# Patient Record
Sex: Male | Born: 1960
Health system: Southern US, Community
[De-identification: ages and names within clinical notes are randomized; demographics above are authoritative.]

## PROBLEM LIST (undated history)

## (undated) DIAGNOSIS — K219 Gastro-esophageal reflux disease without esophagitis: Secondary | ICD-10-CM

## (undated) DIAGNOSIS — I10 Essential (primary) hypertension: Secondary | ICD-10-CM

## (undated) HISTORY — PX: TOTAL HIP ARTHROPLASTY: SHX124

## (undated) HISTORY — PX: KNEE SURGERY: SHX244

---

## 2004-02-01 ENCOUNTER — Ambulatory Visit (HOSPITAL_BASED_OUTPATIENT_CLINIC_OR_DEPARTMENT_OTHER): Admission: RE | Admit: 2004-02-01 | Discharge: 2004-02-01 | Payer: Self-pay | Admitting: Orthopedic Surgery

## 2009-07-23 ENCOUNTER — Encounter: Admission: RE | Admit: 2009-07-23 | Discharge: 2009-07-23 | Payer: Self-pay | Admitting: Sports Medicine

## 2010-05-17 ENCOUNTER — Encounter: Admission: RE | Admit: 2010-05-17 | Discharge: 2010-05-17 | Payer: Self-pay | Admitting: Sports Medicine

## 2010-07-22 ENCOUNTER — Inpatient Hospital Stay (HOSPITAL_COMMUNITY)
Admission: RE | Admit: 2010-07-22 | Discharge: 2010-07-24 | Payer: Self-pay | Source: Home / Self Care | Attending: Orthopedic Surgery | Admitting: Orthopedic Surgery

## 2010-10-14 LAB — SURGICAL PCR SCREEN: Staphylococcus aureus: POSITIVE — AB

## 2010-10-14 LAB — BASIC METABOLIC PANEL
CO2: 29 mEq/L (ref 19–32)
CO2: 31 mEq/L (ref 19–32)
Calcium: 8.3 mg/dL — ABNORMAL LOW (ref 8.4–10.5)
Chloride: 105 mEq/L (ref 96–112)
Creatinine, Ser: 1.12 mg/dL (ref 0.4–1.5)
GFR calc Af Amer: >60 mL/min (ref >60)
GFR calc Af Amer: >60 mL/min (ref >60)
Potassium: 4.1 mEq/L (ref 3.5–5.1)
Sodium: 138 mEq/L (ref 135–145)
Sodium: 139 mEq/L (ref 135–145)

## 2010-10-14 LAB — URINALYSIS, ROUTINE W REFLEX MICROSCOPIC
Bilirubin Urine: NEGATIVE
Hgb urine dipstick: NEGATIVE
Protein, ur: NEGATIVE mg/dL
Specific Gravity, Urine: 1.022 (ref 1.005–1.030)
Urobilinogen, UA: 1 mg/dL (ref 0.0–1.0)

## 2010-10-14 LAB — CBC
HCT: 37.5 % — ABNORMAL LOW (ref 39.0–52.0)
HCT: 46.2 % (ref 39.0–52.0)
Hemoglobin: 12.5 g/dL — ABNORMAL LOW (ref 13.0–17.0)
Hemoglobin: 13.4 g/dL (ref 13.0–17.0)
Hemoglobin: 16.2 g/dL (ref 13.0–17.0)
MCH: 31.3 pg (ref 26.0–34.0)
MCH: 31.8 pg (ref 26.0–34.0)
MCH: 31.9 pg (ref 26.0–34.0)
MCHC: 35.1 g/dL (ref 30.0–36.0)
MCV: 93.8 fL (ref 78.0–100.0)
RBC: 4 MIL/uL — ABNORMAL LOW (ref 4.22–5.81)
RBC: 4.22 MIL/uL (ref 4.22–5.81)
WBC: 8.7 10*3/uL (ref 4.0–10.5)

## 2010-10-14 LAB — APTT: aPTT: 28 seconds (ref 24–37)

## 2010-10-14 LAB — COMPREHENSIVE METABOLIC PANEL
BUN: 13 mg/dL (ref 6–23)
CO2: 28 mEq/L (ref 19–32)
Calcium: 9.6 mg/dL (ref 8.4–10.5)
Creatinine, Ser: 1.61 mg/dL — ABNORMAL HIGH (ref 0.4–1.5)
GFR calc non Af Amer: 46 mL/min — ABNORMAL LOW (ref >60)
Glucose, Bld: 99 mg/dL (ref 70–99)

## 2010-10-14 LAB — TYPE AND SCREEN: Antibody Screen: NEGATIVE

## 2010-10-14 LAB — PROTIME-INR: Prothrombin Time: 12 seconds (ref 11.6–15.2)

## 2010-12-20 NOTE — Op Note (Signed)
NAME:  Jonathan Hoffman, KRANZ NO.:  [id number]   MEDICAL RECORD NO.:  [id number]                   PATIENT TYPE:  AMB   LOCATION:  DSC                                  FACILITY:  [facility]   PHYSICIAN:  Loreta Ave, M.D.              DATE OF BIRTH:  01-Apr-1961   DATE OF PROCEDURE:  02/01/2004  DATE OF DISCHARGE:                                 OPERATIVE REPORT   PREOPERATIVE DIAGNOSIS:  Post-traumatic prepatellar tubercle bursitis with  tendinosis and tearing of patellar tendon at tibial tubercle attachment.   POSTOPERATIVE DIAGNOSIS:  Post-traumatic prepatellar tubercle bursitis with  tendinosis and tearing of patellar tendon at tibial tubercle attachment with  retained sand debris within the bursa.   PROCEDURE:  Left knee exploration and excision of foreign body and bursa in  front of the tibial tubercle.  Extensive debridement and then primary repair  of patellar tendons and tibial tubercle augmented with a 5 mm absorbable  anchor and fiber wire.   SURGEON:  Loreta Ave, M.D.   ASSISTANT:  Arlys John D. Petrarca, P.A.-C.   ANESTHESIA:  General.   BLOOD LOSS:  Minimal.   TOURNIQUET TIME:  Forty-five minutes.   SPECIMENS:  None.   CULTURES:  None.   COMPLICATIONS:  None.   DRESSING:  Soft compressive with knee immobilizer.   PROCEDURE:  The patient was brought to the operating room and placed on the  operating room table in supine position.  After adequate anesthesia had been  obtained, the tourniquet was applied in the upper aspect of the left leg.  Prepped and draped in the usual sterile fashion.  The knee was examined with  full motion instability.  Exsanguinated with elevation of Esmarch.  The  tourniquet was inflated to 350 mmHg.  Incised an oblique incision off the  tubercle.  It was opened, extending a little proximal and distal.  The skin  and subcutaneous tissues were divided.  Very thick and chronic bursa in  front of the tubercle  and distal end of the patellar tendon was excised in  its entirety along with a __________ retained sandy debris foreign material.  Thoroughly irrigated.  The tendon itself had markedly thickened.  Tenon over  the bottom 3 cm, was excised.  Central tearing in the epidural tendon but  was approached with the longitudinal opening of the tendon from the tubercle  to about 3 cm proximal, 50% of the tendon centrally, and on the __________  medially were all torn off with marked degeneration.  All of this treated  with excision of all fibrinous debris, inflammatory tissue, and  nonfunctional tendon.  This left about 50% compromise of the tendon  attachment.  Thorough irrigation.  The 5 mm ankle was placed right in the  middle of the tubercle and the fiber wire suture was then weaved into the  patellar tendon at the distal end and then went well proximally up and down  the  tendon.  Sutured back to itself in the original longitudinal gap of the  tendon.  This gave a nice, firm anchoring of the tendon back to the  tubercle, restoring longitudinal continuity and attachment.  I could fully  flex without too much tension on the repair.  The wound was irrigated.  The  skin was closed with subcutaneous and subcuticular Vicryl.  The margins of  the wound were injected with Marcaine.  Steri-Strips applied.  Sterile  compressive dressing was applied.  The tourniquet was deflated and removed.  Immobilizer applied.  The anesthesia reversed.  Brought to the recovery  room.  Tolerated the surgery well with no complications.                                               Loreta Ave, M.D.    DFM/MEDQ  D:  02/02/2004  T:  02/03/2004  Job:  [id number]

## 2011-12-26 ENCOUNTER — Emergency Department (HOSPITAL_COMMUNITY)
Admission: EM | Admit: 2011-12-26 | Discharge: 2011-12-26 | Disposition: A | Discharge status: Home / Self Care | Payer: BC Managed Care – PPO | Attending: Emergency Medicine | Admitting: Emergency Medicine

## 2011-12-26 ENCOUNTER — Emergency Department (HOSPITAL_COMMUNITY): Payer: BC Managed Care – PPO

## 2011-12-26 ENCOUNTER — Encounter (HOSPITAL_COMMUNITY): Payer: Self-pay | Admitting: *Deleted

## 2011-12-26 DIAGNOSIS — I10 Essential (primary) hypertension: Secondary | ICD-10-CM | POA: Insufficient documentation

## 2011-12-26 DIAGNOSIS — R6884 Jaw pain: Secondary | ICD-10-CM | POA: Insufficient documentation

## 2011-12-26 DIAGNOSIS — M542 Cervicalgia: Secondary | ICD-10-CM | POA: Insufficient documentation

## 2011-12-26 DIAGNOSIS — M25519 Pain in unspecified shoulder: Secondary | ICD-10-CM | POA: Insufficient documentation

## 2011-12-26 DIAGNOSIS — R42 Dizziness and giddiness: Secondary | ICD-10-CM | POA: Insufficient documentation

## 2011-12-26 DIAGNOSIS — R079 Chest pain, unspecified: Secondary | ICD-10-CM | POA: Insufficient documentation

## 2011-12-26 DIAGNOSIS — R000 Tachycardia, unspecified: Secondary | ICD-10-CM | POA: Insufficient documentation

## 2011-12-26 HISTORY — DX: Essential (primary) hypertension: I10

## 2011-12-26 LAB — TROPONIN I: Troponin I: <0.3 ng/mL (ref <0.30)

## 2011-12-26 LAB — CBC
HCT: 45.4 % (ref 39.0–52.0)
Hemoglobin: 16.2 g/dL (ref 13.0–17.0)
MCHC: 35.7 g/dL (ref 30.0–36.0)
MCV: 89.7 fL (ref 78.0–100.0)
RDW: 12.6 % (ref 11.5–15.5)

## 2011-12-26 LAB — POCT I-STAT, CHEM 8
Calcium, Ion: 1.21 mmol/L (ref 1.12–1.32)
Creatinine, Ser: 1.1 mg/dL (ref 0.50–1.35)
Glucose, Bld: 112 mg/dL — ABNORMAL HIGH (ref 70–99)
HCT: 49 % (ref 39.0–52.0)
Hemoglobin: 16.7 g/dL (ref 13.0–17.0)

## 2011-12-26 LAB — D-DIMER, QUANTITATIVE: D-Dimer, Quant: 1.6 ug/mL-FEU — ABNORMAL HIGH (ref 0.00–0.48)

## 2011-12-26 LAB — POCT I-STAT TROPONIN I

## 2011-12-26 MED ORDER — OMEPRAZOLE 20 MG PO CPDR: 20.0000 mg | DELAYED_RELEASE_CAPSULE | Freq: Every day | ORAL | Status: DC

## 2011-12-26 MED ORDER — SODIUM CHLORIDE 0.9 % IV BOLUS (SEPSIS)
1000.0000 mL | Freq: Once | INTRAVENOUS | Status: AC
  Administered 2011-12-26: 1000 mL via INTRAVENOUS

## 2011-12-26 MED ORDER — SODIUM CHLORIDE 0.9 % IV SOLN: INTRAVENOUS | Status: DC

## 2011-12-26 MED ORDER — ASPIRIN 81 MG PO CHEW
324.0000 mg | CHEWABLE_TABLET | Freq: Once | ORAL | Status: AC
  Administered 2011-12-26: 324 mg via ORAL
  Filled 2011-12-26: qty 4

## 2011-12-26 MED ORDER — IOHEXOL 350 MG/ML SOLN
100.0000 mL | Freq: Once | INTRAVENOUS | Status: AC | PRN
  Administered 2011-12-26: 100 mL via INTRAVENOUS

## 2011-12-26 MED ORDER — AMLODIPINE BESYLATE 2.5 MG PO TABS: 2.5000 mg | ORAL_TABLET | Freq: Every day | ORAL | Status: DC

## 2011-12-26 NOTE — Consult Note (Signed)
Patient ID: Jonathan Hoffman MRN: [id number], DOB/AGE: [birth year]   Admit date: 12/26/2011 Date of Consult:   Primary Physician: [name], MD, MD Primary Cardiologist: new   Problem List: Past Medical History  Diagnosis Date  . Hypertension     has never taken medications or followup    History reviewed. No pertinent past surgical history.   Allergies: No Known Allergies  HPI:  Patient is a 51 year old with history of borderline HTN (not on meds) who was referred from [name] office for eval of jaw pain  Was last there a couple years ago  Patient notes over the past several days intermittent jaw tightness.  Bilateral.  More with talking.  Denies teeth grinding.  Does note increased reflux recently  Has taken prevacid in past.  Using tums more now..  .  No radiation  No chest tightness except around L upper chest.  Chest pain in this area gets worse with palpation and radiates to back.  This is known Has had this for a long time Has history of shoulder problems; has had injections.  Today what also concerned him was transient lightheadedness, not feeling well.  Never had before.  No syncope.  No SOB. Went to Dr Newell Coral office   Told to come here.  No SOB   No fevers, chills.   Patient does spin classes 2x per week.  Actually has felt better when  He does this  Did twice this week  No jaw pain with this.    Does admit to drinking up to 6 oz per day ETOH at times.  No tobacco.  Currently jaw still does not feel normal.  Not SOB.  No CP   Inpatient Medications:    . aspirin  324 mg Oral Once  . sodium chloride  1,000 mL Intravenous Once    History reviewed. No pertinent family history.   History   Social History  . Marital Status: Married    Spouse Name: N/A    Number of Children: N/A  . Years of Education: N/A   Occupational History  . Not on file.   Social History Main Topics  . Smoking status: Never Smoker   . Smokeless tobacco: Not on file   . Alcohol Use: Yes  . Drug Use:   . Sexually Active:    Other Topics Concern  . Not on file   Social History Narrative  . No narrative on file     Review of Systems: General: negative for chills, fever.  Weight is up some.  Needs to watch diet. Cardiovascular: negative for chest pain, dyspnea on exertion, edema, orthopnea, palpitations, paroxysmal nocturnal dyspnea or shortness of breath Dermatological: negative for rash Respiratory: negative for cough or wheezing Abdominal: negative for nausea, vomiting, diarrhea, bright red blood per rectum, melena, or hematemesis  All other systems reviewed and are otherwise negative except as noted above.  Physical Exam: Filed Vitals:   12/26/11 1516  BP: 171/108  Pulse: 88  Temp:   Resp: 16   No intake or output data in the 24 hours ending 12/26/11 1755  General: Well developed, well nourished, in no acute distress. Head: Normocephalic, atraumatic, sclera non-icteric  Jaw:  No real exacerbation with palpation of jaw. Neck: Negative for carotid bruits. JVP not elevated. Lungs: Clear bilaterally to auscultation without wheezes, rales, or rhonchi. Breathing is unlabored. Heart: RRR with S1 S2. No murmurs, rubs, or gallops appreciated. Chest:  Left upper chest pain that is worse  with palpation.   Abdomen: Soft, non-tender, non-distended with normoactive bowel sounds. No hepatomegaly. No rebound/guarding. No obvious abdominal masses. Msk:  Strength and tone appears normal for age. Extremities: No clubbing, cyanosis or edema.  Distal pedal pulses are 2+ and equal bilaterally. Neuro: Alert and oriented X 3. Moves all extremities spontaneously. Psych:  Responds to questions appropriately with a normal affect.  Labs: Results for orders placed during the hospital encounter of 12/26/11 (from the past 24 hour(s))  CBC     Status: Normal   Collection Time   12/26/11 11:48 AM      Component Value Range   WBC 7.3  4.0 - 10.5 (K/uL)   RBC 5.06   4.22 - 5.81 (MIL/uL)   Hemoglobin 16.2  13.0 - 17.0 (g/dL)   HCT 16.1  09.6 - 04.5 (%)   MCV 89.7  78.0 - 100.0 (fL)   MCH 32.0  26.0 - 34.0 (pg)   MCHC 35.7  30.0 - 36.0 (g/dL)   RDW 40.9  81.1 - 91.4 (%)   Platelets 197  150 - 400 (K/uL)  POCT I-STAT TROPONIN I     Status: Normal   Collection Time   12/26/11 12:00 PM      Component Value Range   Troponin i, poc 0.00  0.00 - 0.08 (ng/mL)   Comment 3           POCT I-STAT, CHEM 8     Status: Abnormal   Collection Time   12/26/11 12:01 PM      Component Value Range   Sodium 141  135 - 145 (mEq/L)   Potassium 4.4  3.5 - 5.1 (mEq/L)   Chloride 104  96 - 112 (mEq/L)   BUN 15  6 - 23 (mg/dL)   Creatinine, Ser 7.82  0.50 - 1.35 (mg/dL)   Glucose, Bld 956 (*) 70 - 99 (mg/dL)   Calcium, Ion 2.13  0.86 - 1.32 (mmol/L)   TCO2 26  0 - 100 (mmol/L)   Hemoglobin 16.7  13.0 - 17.0 (g/dL)   HCT 57.8  46.9 - 62.9 (%)  D-DIMER, QUANTITATIVE     Status: Abnormal   Collection Time   12/26/11  2:20 PM      Component Value Range   D-Dimer, Quant 1.60 (*) 0.00 - 0.48 (ug/mL-FEU)  TROPONIN I     Status: Normal   Collection Time   12/26/11  2:20 PM      Component Value Range   Troponin I <0.30  <0.30 (ng/mL)  TROPONIN I     Status: Normal   Collection Time   12/26/11  4:52 PM      Component Value Range   Troponin I <0.30  <0.30 (ng/mL)    Radiology/Studies: Dg Chest 2 View  12/26/2011  *RADIOLOGY REPORT*  Clinical Data: Chest pain.  CHEST - 2 VIEW  Comparison: 07/18/2010  Findings: Low lung volumes are again seen.  Both lungs are clear. Heart size is within normal limits.  No mass or lymphadenopathy identified.  IMPRESSION: Low lung volumes.  No active disease.  Original Report Authenticated By: Danae Orleans, M.D.   Ct Angio Chest W/cm &/or Wo Cm  12/26/2011  *RADIOLOGY REPORT*  Clinical Data: Chest pain.  Dizziness.  Elevated D-dimer.  CT ANGIOGRAPHY CHEST  Technique:  Multidetector CT imaging of the chest using the standard protocol during  bolus administration of intravenous contrast. Multiplanar reconstructed images including MIPs were obtained and reviewed to evaluate the vascular anatomy.  Contrast: OMNIPAQUE IOHEXOL 350 MG/ML SOLN  Comparison: None.  Findings: Satisfactory opacification of the pulmonary arteries noted, and there is no evidence of pulmonary emboli.  No evidence of thoracic aortic aneurysm or dissection.  No evidence of mediastinal hematoma or mass.  No evidence of pericardial or pleural effusion.  The both lungs are clear.  No evidence of pulmonary infiltrate or mass.  Central tracheobronchial airways are patent.  IMPRESSION: Negative.  No evidence of pulmonary embolism or other active disease.  Original Report Authenticated By: Danae Orleans, M.D.    EKG:SR 86 bpm.  INcomplete RBBB.  ASSESSMENT AND PLAN:   Patient is a 52 year old with no history of CAD  Presents with bilateral jaw pain that has been intermitt over about a week.  Atypical.  Not associated with aerobic activty.  Actually has felt better when he exercises. Does have known intermitt CP that is worse with palpation  Exam with signif elevation of BP Also,  CP exacerb with palpation on L EKG unremarkable. CT negative for PE.  No coronary calcification seen Labs negative trop.  D dimer increased    OVerall, I do not think jaw pain is an anginal equivalent.  May be related to reflux.  May be related to HTN.  May be unrelated to either  For now I would recomm empiric Rx of reflux with omeprazole. I would also begin Rx for HTN.  Start low with amlodipine 2.5 mg.   Will arrange f/u in clinic Counselled on limiting alcohol.  Keep active.  Try to cut down wt some Will need check of fasting lipids as outpatient  OK to D/C home  Again, will arrange outpatient f/u.    Signed, Dietrich Pates 12/26/2011, 5:55 PM

## 2011-12-26 NOTE — Discharge Instructions (Signed)
It is very important to follow up as directed, with your cardiologist.  If you develop any new, or concerning changes in your condition, please return to the emergency department immediately.

## 2011-12-26 NOTE — ED Notes (Signed)
Pt presents with 1 week h/o increasing blood pressure.  Pt reports increased stress at work, has been seen x 2 previously for HTN but did not follow up.  Pt reports 3-4 year h/o L shoulder pain, usually gets steroid injections for pain and has been taken ibuprofen for same that relieves pain.  Pt reports pain to L upper chest that is the same pain as shoulder pain in previous years.  Pt denies any shortness of breath or difference in pain.

## 2011-12-26 NOTE — ED Provider Notes (Signed)
Patient seen in stricture triage and screen. Patient was referred in by his primary care doctor's office for admission for take symptoms of chest discomfort dizziness and jaw pain on and off for the past 5 days. Patient had an episode this morning while in court. I went to see his primary care doctor referred here. Past medical history for cardiac risk factors is negative except for a new finding today of hypertension and was told he was borderline in the past but not treated for hypertension. The duration of the chest or jaw discomfort at most has been 15-20 minutes.  Initial workup in the emergency department troponin was negative chest x-ray was negative patient could be eliciting early warning signs of coronary artery disease we'll repeat the troponin will get a d-dimer case were dealing with a pulmonary embolism type process. Found out subsequently that his primary care doctor had called Dr. Carleene Cooper and that they had recommended direct admission through the ED. Will have patient moved back to the a pot for further workup from the stricture triage area.  Patient given aspirin 325 mg by mouth to chew.    Shelda Jakes, MD 12/26/11 [phone]

## 2011-12-26 NOTE — ED Provider Notes (Signed)
History     CSN: [id number]  Arrival date & time 12/26/11  1126   First MD Initiated Contact with Patient 12/26/11 1311      Chief Complaint  Patient presents with  . Chest Pain  . Arm Pain     HPI This 51 year old male presents with left shoulder pain, new neck and jaw pain and lightheadedness.  He notes that for some time he has had intermittent left shoulder pain, which is constant, reproducible with positioning, not exertional, sore.  Over the past 3 days he has become aware of intermittent bilateral jaw tightness.  He notes that the symptom seems more present with activity, seems to improve with rest.  There is minimal radiation inferiorly.  He notes mild generalized neck discomfort currently.  Just complains of mild lightheadedness throughout these past few days.  He denies any central anterior chest pain, significant dyspnea.  He denies any fevers, chills.  He has a prior history of borderline hypertension, with no active medication use.  Today after his symptoms were persistent he saw a primary care physician.  He was referred here for further evaluation. Past Medical History  Diagnosis Date  . Hypertension     has never taken medications or followup    History reviewed. No pertinent past surgical history.  History reviewed. No pertinent family history.  History  Substance Use Topics  . Smoking status: Never Smoker   . Smokeless tobacco: Not on file  . Alcohol Use: Yes      Review of Systems  Constitutional:       Per HPI, otherwise negative  HENT:       Per HPI, otherwise negative  Eyes: Negative.   Respiratory:       Per HPI, otherwise negative  Cardiovascular:       Per HPI, otherwise negative  Gastrointestinal: Negative for vomiting.  Genitourinary: Negative.   Musculoskeletal:       Per HPI, otherwise negative  Skin: Negative.   Neurological: Negative for syncope.    Allergies  Review of patient's allergies indicates no known allergies.  Home  Medications   Current Outpatient Rx  Name Route Sig Dispense Refill  . HYDROCODONE-ACETAMINOPHEN 5-325 MG PO TABS Oral Take 1 tablet by mouth every 8 (eight) hours as needed. For pain    . IBUPROFEN 200 MG PO TABS Oral Take 200 mg by mouth every 8 (eight) hours as needed. For pain      BP 171/108  Pulse 88  Temp(Src) 97.9 F (36.6 C) (Oral)  Resp 16  SpO2 100%  Physical Exam  Nursing note and vitals reviewed. Constitutional: He is oriented to person, place, and time. He appears well-developed. No distress.  HENT:  Head: Normocephalic and atraumatic.  Eyes: Conjunctivae and EOM are normal.  Cardiovascular: Normal rate and regular rhythm.   Pulmonary/Chest: Effort normal. No stridor. No respiratory distress.  Abdominal: He exhibits no distension.  Musculoskeletal: He exhibits no edema.  Neurological: He is alert and oriented to person, place, and time.  Skin: Skin is warm and dry.  Psychiatric: He has a normal mood and affect.    ED Course  Procedures (including critical care time)  Labs Reviewed  POCT I-STAT, CHEM 8 - Abnormal; Notable for the following:    Glucose, Bld 112 (*)    All other components within normal limits  D-DIMER, QUANTITATIVE - Abnormal; Notable for the following:    D-Dimer, Quant 1.60 (*)    All other components within normal limits  CBC  POCT I-STAT TROPONIN I  TROPONIN I   Dg Chest 2 View  12/26/2011  *RADIOLOGY REPORT*  Clinical Data: Chest pain.  CHEST - 2 VIEW  Comparison: 07/18/2010  Findings: Low lung volumes are again seen.  Both lungs are clear. Heart size is within normal limits.  No mass or lymphadenopathy identified.  IMPRESSION: Low lung volumes.  No active disease.  Original Report Authenticated By: Danae Orleans, M.D.     No diagnosis found.  Cardiac: 88sr, normal  Pulse ox 100%, ra normal   Date: 12/26/2011  Rate: 86  Rhythm: normal sinus rhythm  QRS Axis: normal  Intervals: normal  ST/T Wave abnormalities: normal   Conduction Disutrbances:none  Narrative Interpretation:   Old EKG Reviewed: no sig changes Unremarkable ECG  6:09 PM Patient resting comfortably, we discussed all results thus far. MDM  This patient presents with new complaints of jaw tightness, lightheadedness, and on initial exam is hypertensive, tachycardic.  Given the patient's description of hypertension, has hypercholesterolemia and has new complaints there is some suspicion of unstable angina, though the absence of any chest pain, or true dyspnea is reassuring.  The patient's initial evaluation, including his ECG, troponin, labs his reassuring.  The patient did have a positive d-dimer, which was evaluated with a CT of his chest, which was negative.  Throughout this time period the patient remained essentially asymptomatic.  The patient was seen by our cardiology colleagues.  Given the absence of notable distress, his reassuring evaluation here, he was discharged to follow up with cardiology for further provocative testing.  Gerhard Munch, MD 12/26/11 [phone]

## 2011-12-26 NOTE — ED Notes (Signed)
Pt states went to doctors office this am, reports dizziness, left sided chest discomfort (states he has nerve isssues to the left side of his chest, states if he presses on his chest pain radiates down left arm), reports episode of clamminess at doctors office.

## 2012-01-02 ENCOUNTER — Ambulatory Visit (INDEPENDENT_AMBULATORY_CARE_PROVIDER_SITE_OTHER): Payer: BC Managed Care – PPO | Admitting: Internal Medicine

## 2012-01-02 ENCOUNTER — Encounter: Payer: Self-pay | Admitting: Internal Medicine

## 2012-01-02 VITALS — BP 148/105 | HR 72 | Ht 73.0 in | Wt 243.0 lb

## 2012-01-02 DIAGNOSIS — K219 Gastro-esophageal reflux disease without esophagitis: Secondary | ICD-10-CM

## 2012-01-02 DIAGNOSIS — R6884 Jaw pain: Secondary | ICD-10-CM

## 2012-01-02 DIAGNOSIS — I10 Essential (primary) hypertension: Secondary | ICD-10-CM

## 2012-01-02 MED ORDER — AMLODIPINE BESYLATE 5 MG PO TABS: 5.0000 mg | ORAL_TABLET | Freq: Every day | ORAL | Status: DC

## 2012-01-02 MED ORDER — OMEPRAZOLE 20 MG PO CPDR: 20.0000 mg | DELAYED_RELEASE_CAPSULE | Freq: Every day | ORAL | Status: DC

## 2012-01-02 NOTE — Progress Notes (Signed)
HPI Patient is a 51 year old who was seen 1 week ago in the [facility] ER  He presents today for follow up The patient was seen one week ago.  Noted intermittent jaw tightness, bilaterally.  More with talking  Did not think he ground his teeth.  Said sympoms not associated with activity, felt better when exercising. Also had some L shoulder pain that he has had fro years, attrib to DJD of shoulder. Noted to be hypertensive in ER Impression was jaw discomfort was not cardiac Did not reflux  Given Rx for Omeprazole. Also added  norvasc 2.5 to regimen  Since seen he has been taking norvasc.  Still with tightness in jaw. Again, not associated with activity. Breathing is good.  Feels good doing spin.  No CP Does note improvement in reflux Is drinking less. No Known Allergies  Current Outpatient Prescriptions  Medication Sig Dispense Refill  . amLODipine (NORVASC) 2.5 MG tablet Take 1 tablet (2.5 mg total) by mouth daily.  30 tablet  0  . HYDROcodone-acetaminophen (NORCO) 5-325 MG per tablet Take 1 tablet by mouth every 8 (eight) hours as needed. For pain      . omeprazole (PRILOSEC) 20 MG capsule Take 1 capsule (20 mg total) by mouth daily.  30 capsule  0    Past Medical History  Diagnosis Date  . Hypertension     has never taken medications or followup    Past Surgical History  Procedure Date  . Total hip arthroplasty   . Knee surgery     Family History  Problem Relation Age of Onset  . Cancer Maternal Grandfather   . Cancer Paternal Grandfather     History   Social History  . Marital Status: Married    Spouse Name: N/A    Number of Children: N/A  . Years of Education: N/A   Occupational History  . Not on file.   Social History Main Topics  . Smoking status: Never Smoker   . Smokeless tobacco: Not on file  . Alcohol Use: Yes     about 4 t imes a week  . Drug Use: Not on file     unknown- 1987 0r prior to that  . Sexually Active: Not on file   Other Topics  Concern  . Not on file   Social History Narrative  . No narrative on file    Review of Systems:  All systems reviewed.  They are negative to the above problem except as previously stated.  Vital Signs: BP 148/105  Pulse 72  Ht 6\' 1"  (1.854 m)  Wt 243 lb (110.224 kg)  BMI 32.06 kg/m2  Physical Exam  Patient is in NAD Lungs CTA.  No wheezes or rales. Cardiac RRR.  No S3.  No murmurs. Ext No edema.  Assessment and Plan:  1.  HTN  Still not controlled.  I would recomm increasing Norvasc to 5 mg per day.  F/U in 4 to 6 wks. Patient has cut back on alcohol.  Rec wt loss to low 200s. Stay active Extensive time spent counselling on BP, wt, heart healthy habits. Follow up 1 month.  2.  Jaw pain.  This is symptom he had when he went to th ER 1 week ago.  Again he reconfirms that it is not associated with activity.   Will discuss with ENT options for evaluation.  3.  Reflux.  Continue omeprazole.  Watch foods that exacerbate.  Lose wt.

## 2012-01-02 NOTE — Patient Instructions (Signed)
Follow up with Dr.Ross in 6 weeks.  

## 2012-01-05 ENCOUNTER — Other Ambulatory Visit: Payer: Self-pay | Admitting: *Deleted

## 2012-01-05 ENCOUNTER — Telehealth: Payer: Self-pay | Admitting: *Deleted

## 2012-01-05 NOTE — Telephone Encounter (Signed)
LEFT MESSAGE FOR PT TO CALL BACK RE WHEN IT WOULD BE CONVENIENT  TO SCHEDULE  CT SINUS . AWAITING RETURN CALL ./CY

## 2012-01-06 NOTE — Telephone Encounter (Signed)
LEFT MESSAGE ONCE AGAIN TO SEE WHEN  COULD SCHEDULE CT SINUS./CY

## 2012-01-17 IMAGING — CR DG CHEST 2V
2 series · 2 of 2 positions shown · non-contrast
Comparison: None

CLINICAL DATA: Right hip replacement, preop.

CHEST - 2 VIEW

[view not recorded (1 of 2)]
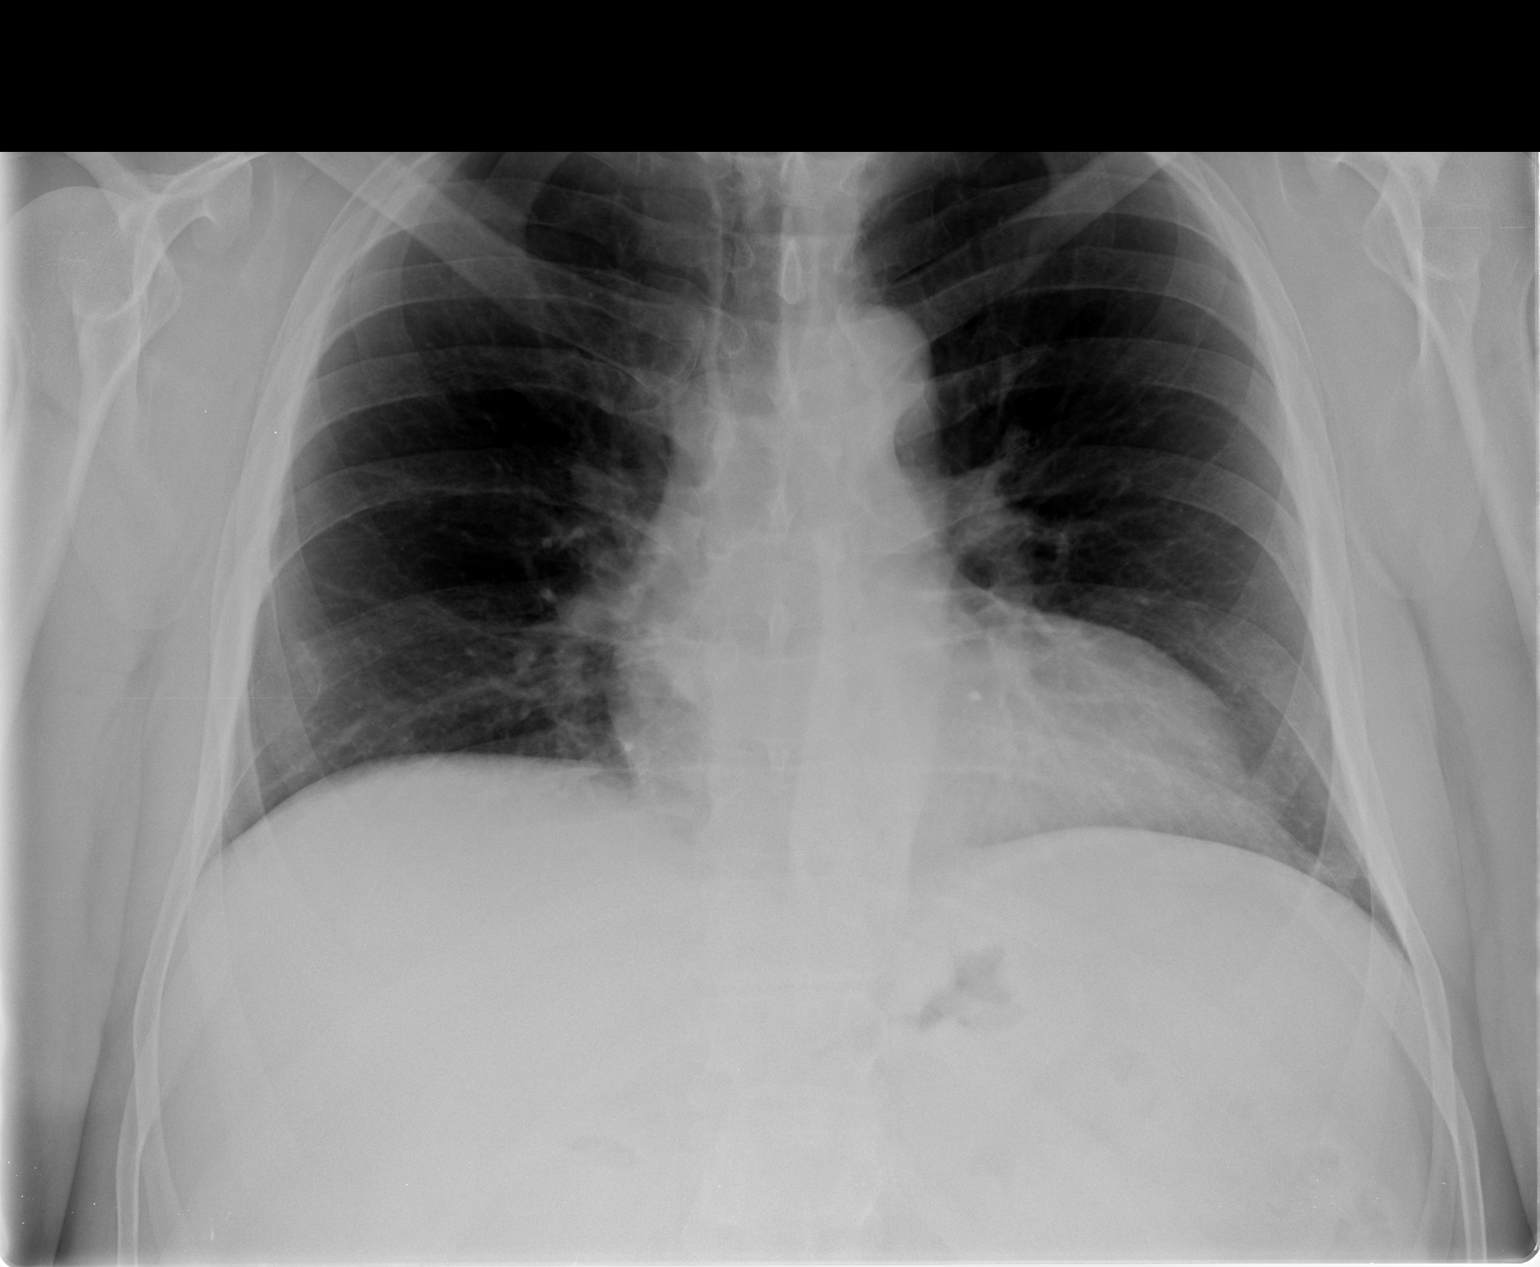

[view not recorded (2 of 2)]
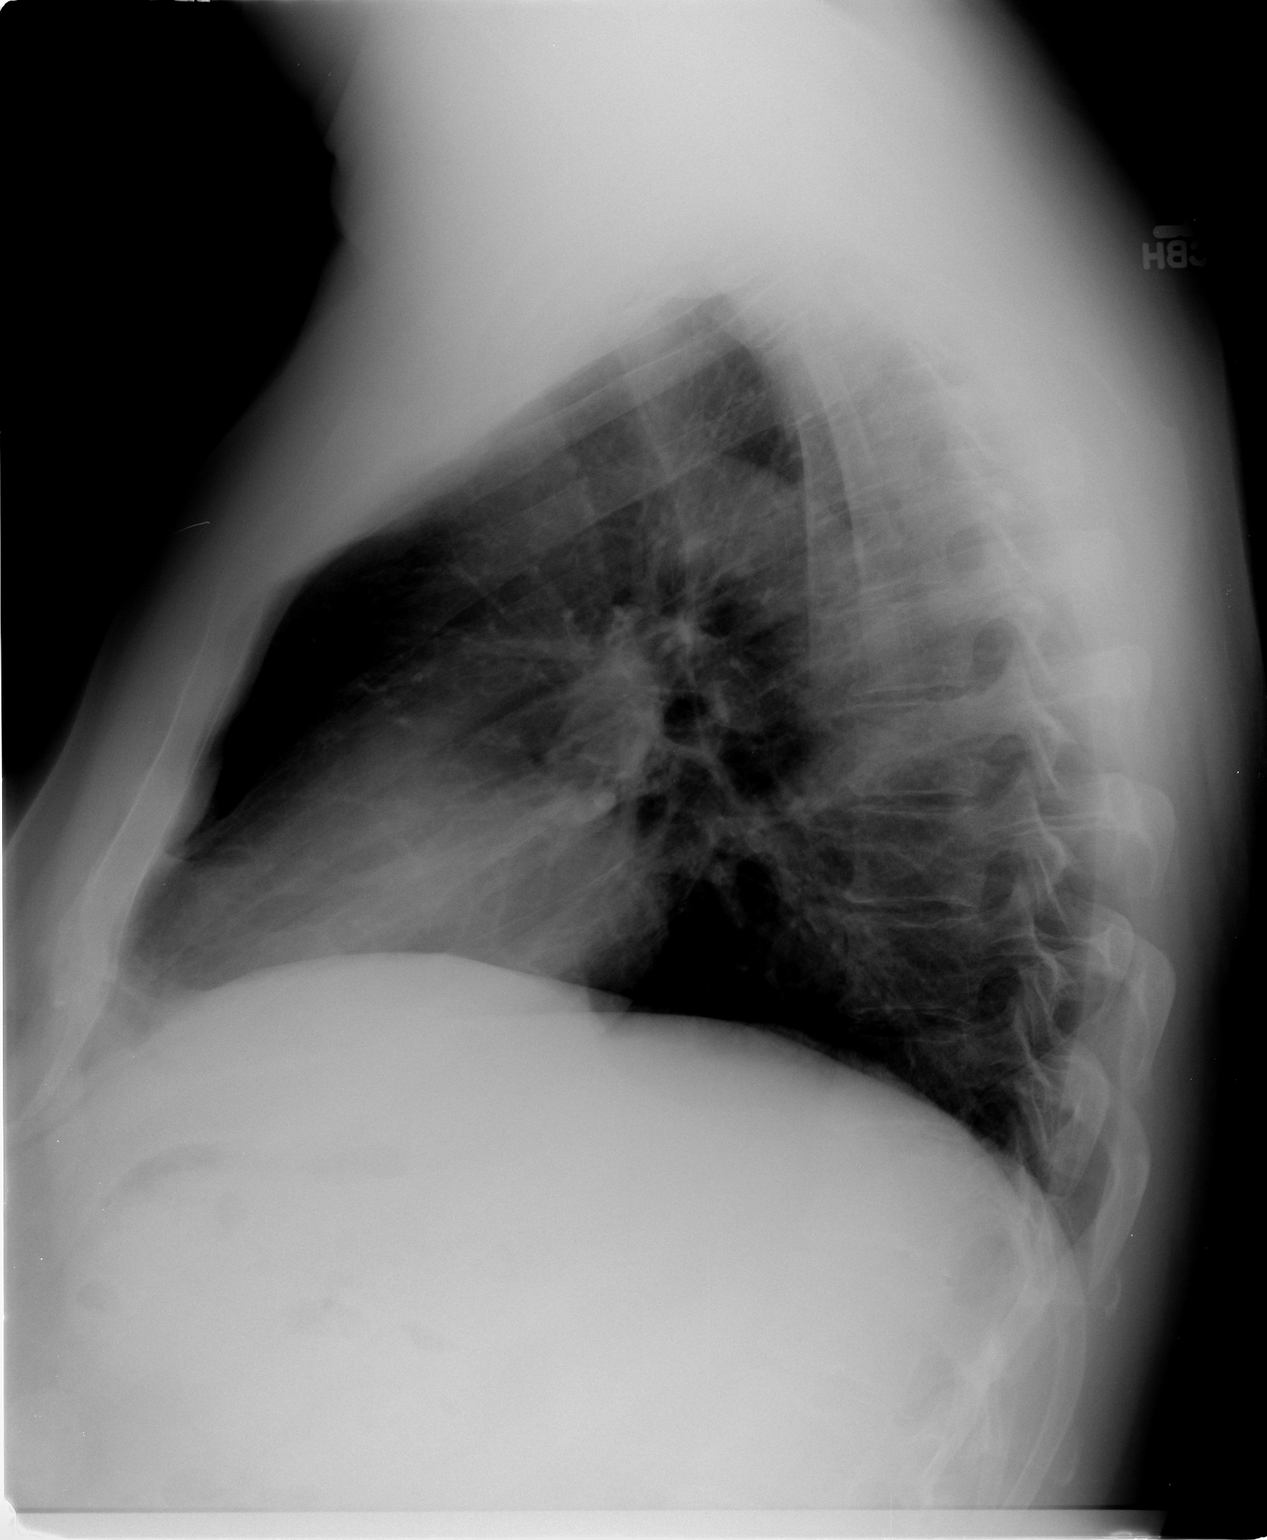

[2 of 2 positions shown; findings below may reference images not displayed]

FINDINGS: Heart and mediastinal contours are within normal limits.
No focal opacities or effusions.  No acute bony abnormality.
IMPRESSION: No active disease.

## 2012-03-04 ENCOUNTER — Ambulatory Visit: Payer: BC Managed Care – PPO | Admitting: Internal Medicine

## 2012-05-04 ENCOUNTER — Encounter: Payer: Self-pay | Admitting: Internal Medicine

## 2012-09-21 ENCOUNTER — Other Ambulatory Visit: Payer: Self-pay

## 2012-09-21 MED ORDER — AMLODIPINE BESYLATE 5 MG PO TABS: 5.0000 mg | ORAL_TABLET | Freq: Every day | ORAL | Status: DC

## 2012-09-21 MED ORDER — OMEPRAZOLE 20 MG PO CPDR: 20.0000 mg | DELAYED_RELEASE_CAPSULE | Freq: Every day | ORAL | Status: DC

## 2012-09-27 ENCOUNTER — Other Ambulatory Visit: Payer: Self-pay | Admitting: Internal Medicine

## 2012-12-06 ENCOUNTER — Telehealth: Payer: Self-pay | Admitting: Internal Medicine

## 2012-12-06 NOTE — Telephone Encounter (Signed)
New problem   Pt want to know if he BP medication can be increased. Please call pt

## 2012-12-07 NOTE — Telephone Encounter (Signed)
Patient has not been seen in 1 year.  Called to report that PCP says his blood pressure is high at 140/85.  Pt states he is going to increase his Norvasc from 5mg  to 7.5mg  until he sees Dr. Tenny Craw on 12/17/2012.  Advised pt against increasing meds.  Pt pretty much says that is what he is going to do.  Advised pt to keep a good record of his reading and bring to appt.  Pt agreed.

## 2012-12-17 ENCOUNTER — Encounter: Payer: Self-pay | Admitting: Internal Medicine

## 2012-12-17 ENCOUNTER — Ambulatory Visit (INDEPENDENT_AMBULATORY_CARE_PROVIDER_SITE_OTHER): Payer: BC Managed Care – PPO | Admitting: Internal Medicine

## 2012-12-17 VITALS — BP 156/109 | HR 78 | Ht 73.0 in | Wt 243.0 lb

## 2012-12-17 DIAGNOSIS — I10 Essential (primary) hypertension: Secondary | ICD-10-CM

## 2012-12-17 LAB — LIPID PANEL: HDL: 82.7 mg/dL (ref >39.00)

## 2012-12-17 LAB — CBC WITH DIFFERENTIAL/PLATELET
Basophils Relative: 0.3 % (ref 0.0–3.0)
Eosinophils Absolute: 0.1 10*3/uL (ref 0.0–0.7)
Hemoglobin: 16.1 g/dL (ref 13.0–17.0)
MCHC: 34.2 g/dL (ref 30.0–36.0)
MCV: 92 fl (ref 78.0–100.0)
Monocytes Absolute: 0.8 10*3/uL (ref 0.1–1.0)
Neutro Abs: 7.3 10*3/uL (ref 1.4–7.7)
Neutrophils Relative %: 71.9 % (ref 43.0–77.0)
RBC: 5.14 Mil/uL (ref 4.22–5.81)

## 2012-12-17 LAB — BASIC METABOLIC PANEL
Calcium: 9.8 mg/dL (ref 8.4–10.5)
GFR: 81.45 mL/min (ref >60.00)
Glucose, Bld: 104 mg/dL — ABNORMAL HIGH (ref 70–99)
Sodium: 139 mEq/L (ref 135–145)

## 2012-12-17 MED ORDER — TRIAMTERENE-HCTZ 37.5-25 MG PO TABS: 0.5000 | ORAL_TABLET | Freq: Every day | ORAL | Status: DC

## 2012-12-17 NOTE — Patient Instructions (Addendum)
Your physician recommends that you schedule a follow-up appointment in: 1 month with Dr. Tenny Craw.  LABS TODAY:  Cbc, bmet, lipids, tsh  Start Maxzide 37.5/25mg  1/2 tablet daily.

## 2012-12-17 NOTE — Progress Notes (Signed)
HPI Patient is a 52 yo who presents for reevaluation.  He was last in clinic about 1 year ago.  PRior to that I saw him in the ER for jaw tightness.  Flonnie Hailstone I saw him last spring I did not think jaw discomfort was cardiac in origin. I did start him on amlodipine for his blood pressure.  Since seen last he remains active.  Does spin class a couple times per week  No CP  No Jaw pain associated  Breathing is OK He did have BP checked and it has remained high No Known Allergies  Current Outpatient Prescriptions  Medication Sig Dispense Refill  . amLODipine (NORVASC) 5 MG tablet TAKE 1 TABLET BY MOUTH EVERY DAY  30 tablet  0  . HYDROcodone-acetaminophen (NORCO) 5-325 MG per tablet Take 1 tablet by mouth every 8 (eight) hours as needed. For pain      . omeprazole (PRILOSEC) 20 MG capsule TAKE ONE CAPSULE BY MOUTH EVERY DAY  30 capsule  0   No current facility-administered medications for this visit.    Past Medical History  Diagnosis Date  . Hypertension     has never taken medications or followup    Past Surgical History  Procedure Laterality Date  . Total hip arthroplasty    . Knee surgery      Family History  Problem Relation Age of Onset  . Cancer Maternal Grandfather   . Cancer Paternal Grandfather     History   Social History  . Marital Status: Married    Spouse Name: N/A    Number of Children: N/A  . Years of Education: N/A   Occupational History  . Not on file.   Social History Main Topics  . Smoking status: Never Smoker   . Smokeless tobacco: Not on file  . Alcohol Use: Yes     Comment: about 4 t imes a week  . Drug Use: Not on file     Comment: unknown- 1987 0r prior to that  . Sexually Active: Not on file   Other Topics Concern  . Not on file   Social History Narrative  . No narrative on file    Review of Systems:  All systems reviewed.  They are negative to the above problem except as previously stated.  Vital Signs: BP 156/109  Pulse 78  Ht 6\' 1"   (1.854 m)  Wt 243 lb (110.224 kg)  BMI 32.07 kg/m2  Physical Exam Patient is in NAD HEENT:  Normocephalic, atraumatic. EOMI, PERRLA.  Neck: JVP is normal.  No bruits.  Lungs: clear to auscultation. No rales no wheezes.  Heart: Regular rate and rhythm. Normal S1, S2. No S3.   No significant murmurs. PMI not displaced.  Abdomen:  Supple, nontender. Normal bowel sounds. No masses. No hepatomegaly.  Extremities:   Good distal pulses throughout. No lower extremity edema.  Musculoskeletal :moving all extremities.  Neuro:   alert and oriented x3.  CN II-XII grossly intact.  EKG:  SR  rSR' V1  (unchanged from 2013) Assessment and Plan:  1.  HTN  Patients BP is still not controlled  Discussed weight loss, limiting alcohol I would recomm adding another agent to his regimen  I do nto think going up on amlodipine will cover. I would add Maxzide 37.5/25   Take 1/2 tab per day I think if he makes some changes in diet and loses weight he will help his bp  He is motivated to do this. Will check  labs today. F/u in clinic in 1 month.  2.  HCM  Will check lipids, CBC< TSH today.

## 2013-01-25 ENCOUNTER — Other Ambulatory Visit: Payer: Self-pay | Admitting: Internal Medicine

## 2013-01-28 ENCOUNTER — Ambulatory Visit (INDEPENDENT_AMBULATORY_CARE_PROVIDER_SITE_OTHER): Payer: BC Managed Care – PPO | Admitting: Internal Medicine

## 2013-01-28 ENCOUNTER — Encounter: Payer: Self-pay | Admitting: Internal Medicine

## 2013-01-28 VITALS — BP 144/104 | HR 79 | Ht 73.0 in | Wt 242.0 lb

## 2013-01-28 DIAGNOSIS — I10 Essential (primary) hypertension: Secondary | ICD-10-CM

## 2013-01-28 MED ORDER — AMLODIPINE BESYLATE 5 MG PO TABS: 5.0000 mg | ORAL_TABLET | Freq: Two times a day (BID) | ORAL | Status: DC

## 2013-01-28 NOTE — Progress Notes (Signed)
HPI Patient is a 52 yo with a history of HTN and reflux.  Also history of atypical jaw pain.  I saw him in May  At that visit  I increased norvasc to 5 mg.  Told to continue maxzide   SInce seen he denies CP  No SOB  No edema.  He has cut back on drinking alcohol, still drinks on weekends BP at home has been high when he has checked. No Known Allergies HPI  No Known Allergies  Current Outpatient Prescriptions  Medication Sig Dispense Refill  . amLODipine (NORVASC) 5 MG tablet Take 1 tablet (5 mg total) by mouth 2 (two) times daily.  180 tablet  3  . HYDROcodone-acetaminophen (NORCO) 5-325 MG per tablet Take 1 tablet by mouth every 8 (eight) hours as needed. For pain      . omeprazole (PRILOSEC) 20 MG capsule TAKE 1 CAPSULE BY MOUTH ONCE DAILY  30 capsule  10  . triamterene-hydrochlorothiazide (MAXZIDE-25) 37.5-25 MG per tablet Take 0.5 tablets by mouth daily.  45 tablet  3   No current facility-administered medications for this visit.      Vital Signs: BP 144/104  Pulse 79  Ht 6\' 1"  (1.854 m)  Wt 242 lb (109.77 kg)  BMI 31.93 kg/m2   Assessment and Plan:  1.  HTN  Not optimal.  I would increase Norvasc to 10  Keep on current maxzide but may need to increase as well.  Patient would like to do in stepwise fashion He will email BP response in a few wks.  Adjustments will be made then.

## 2013-01-31 ENCOUNTER — Other Ambulatory Visit: Payer: Self-pay | Admitting: *Deleted

## 2013-06-26 IMAGING — CT CT ANGIO CHEST
2 of 6 series · 19 of 46 positions shown · IV contrast (APPLIED)
Comparison: None.

CLINICAL DATA: Chest pain.  Dizziness.  Elevated D-dimer.

CT ANGIOGRAPHY CHEST
TECHNIQUE: Multidetector CT imaging of the chest using the
standard protocol during bolus administration of intravenous
contrast. Multiplanar reconstructed images including MIPs were
obtained and reviewed to evaluate the vascular anatomy.
Contrast: 100mL OMNIPAQUE IOHEXOL 350 MG/ML SOLN

[Series 6: pulm embolism 1.0 b25f thin · axial · 0.76mm/px · z∈[-100,+128]mm · 16 of 250 slices shown]
[im 11/250  lung]
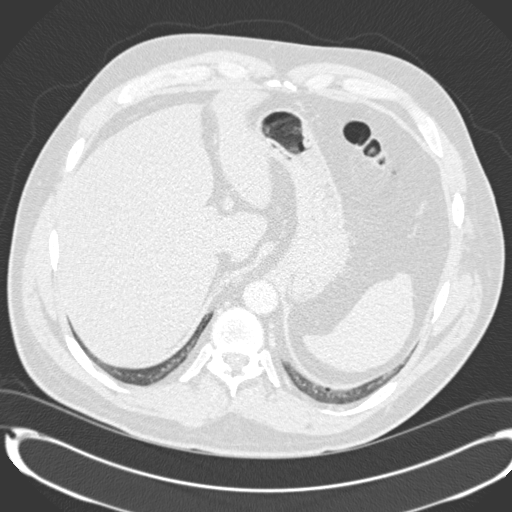
[im 33/250  soft-tissue]
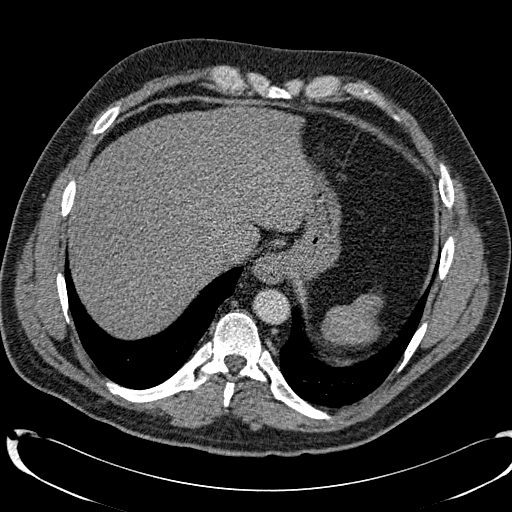
[im 44/250  lung]
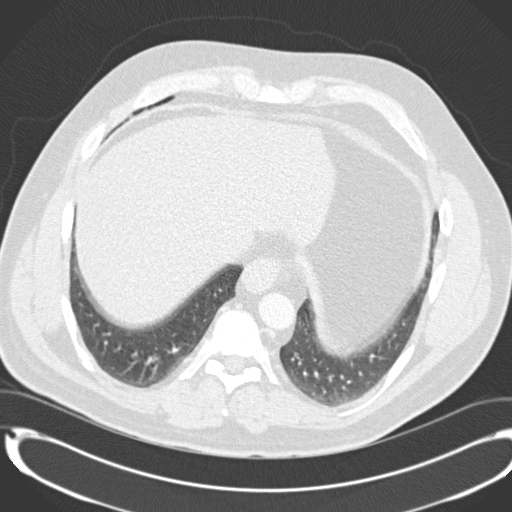
[im 55/250  soft-tissue]
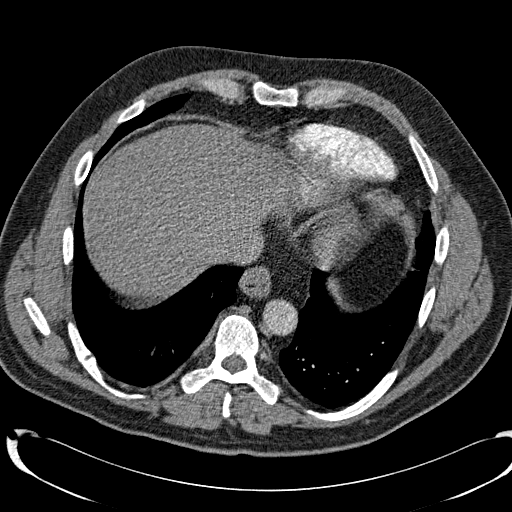
[im 76/250  lung]
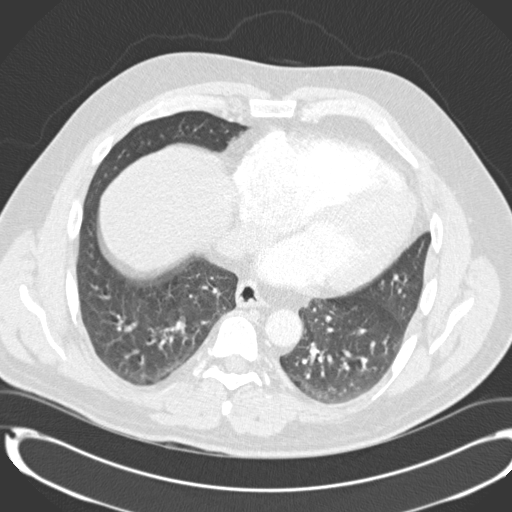
[im 87/250  soft-tissue]
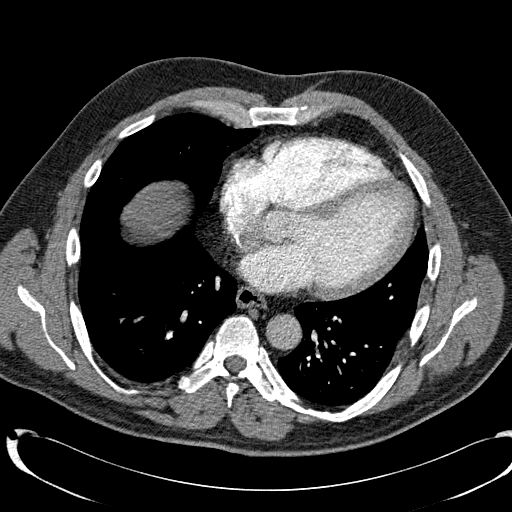
[im 98/250  lung]
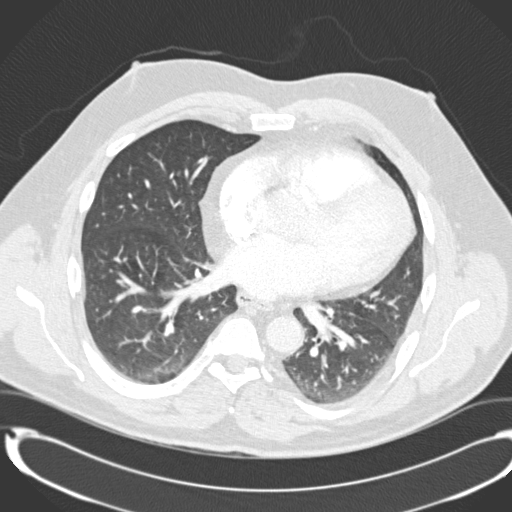
[im 120/250  soft-tissue]
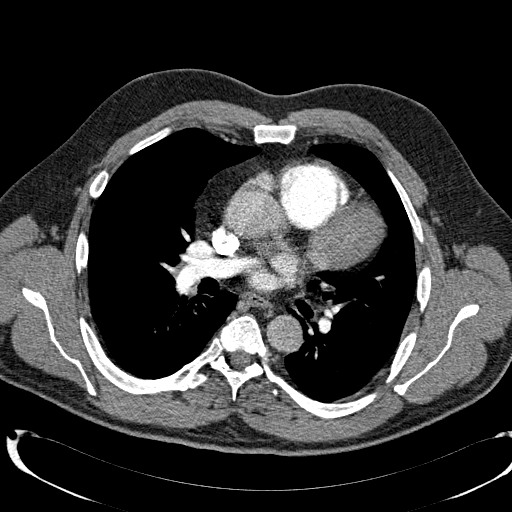
[im 130/250  lung]
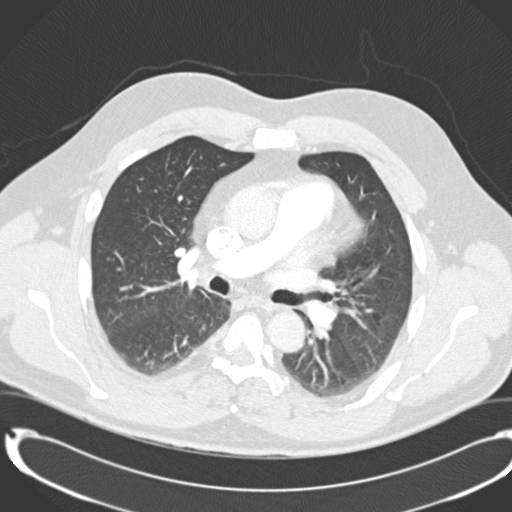
[im 152/250  soft-tissue]
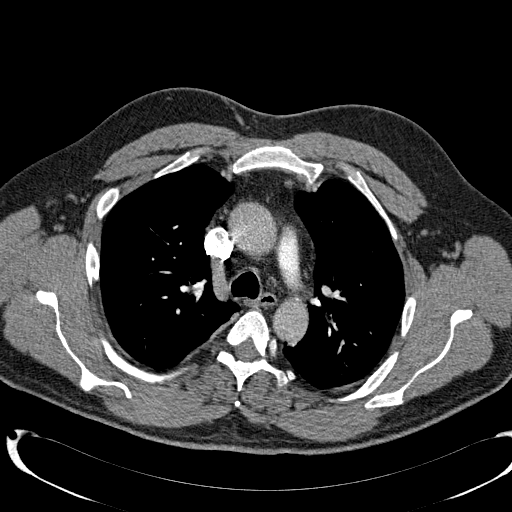
[im 163/250  lung]
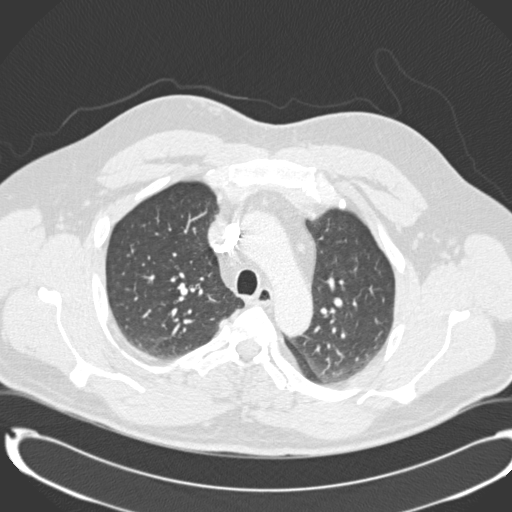
[im 174/250  soft-tissue]
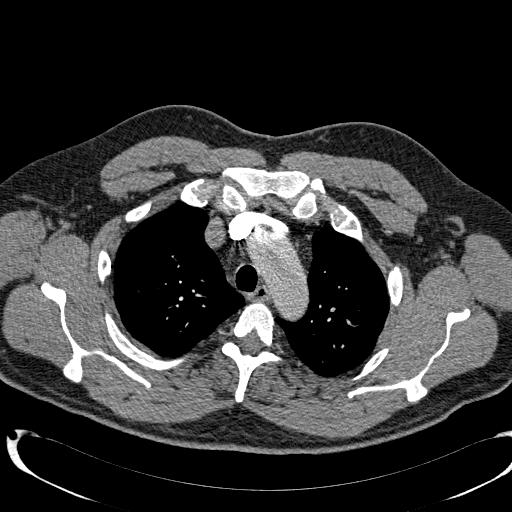
[im 195/250  lung]
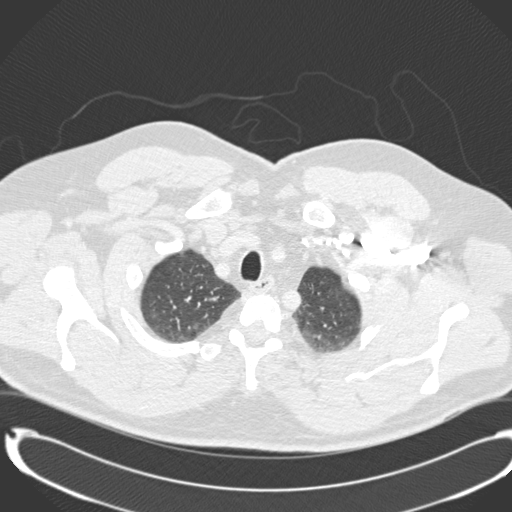
[im 206/250  soft-tissue]
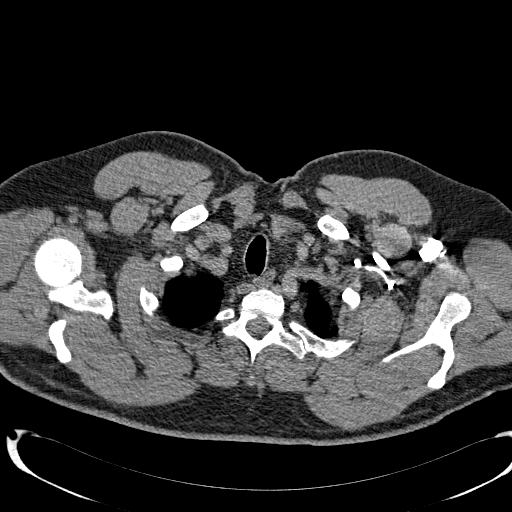
[im 217/250  lung]
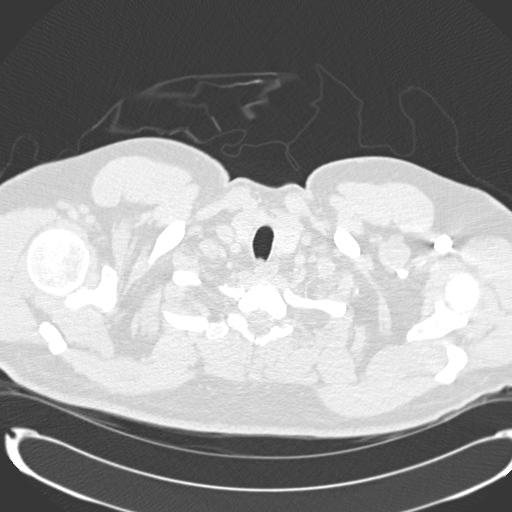
[im 239/250  soft-tissue]
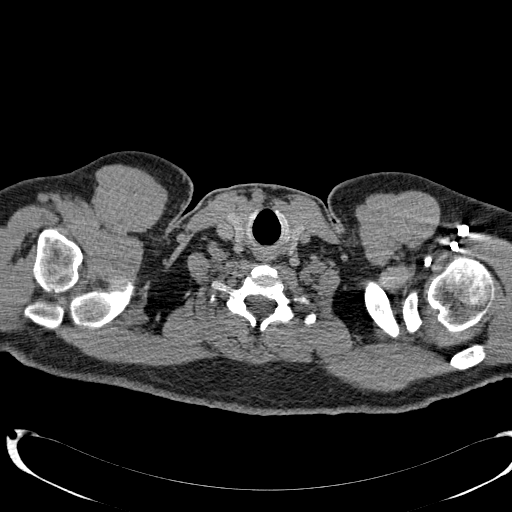

[Series 8: coronals · coronal · 0.70mm/px · 3 of 139 slices shown]
[im 35/139  soft-tissue]
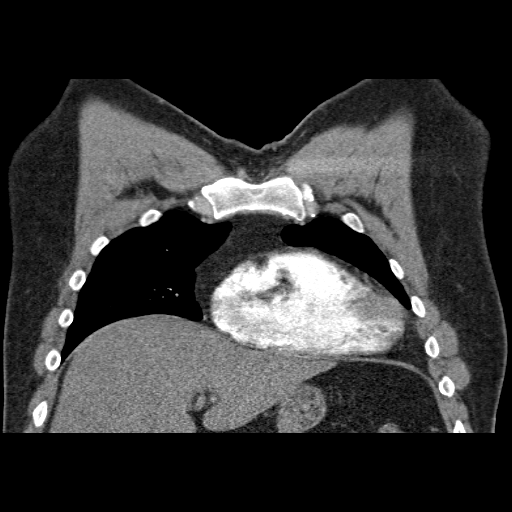
[im 70/139  soft-tissue]
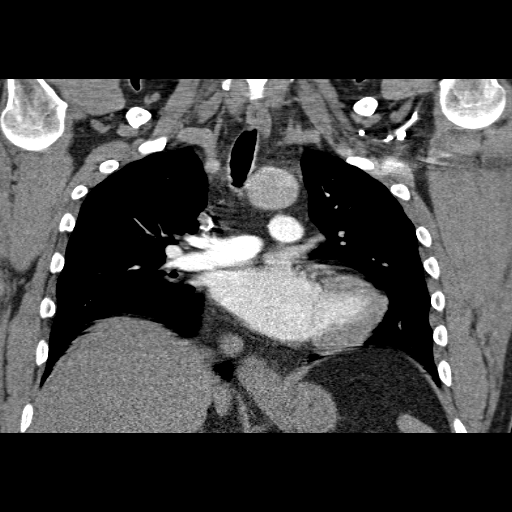
[im 104/139  soft-tissue]
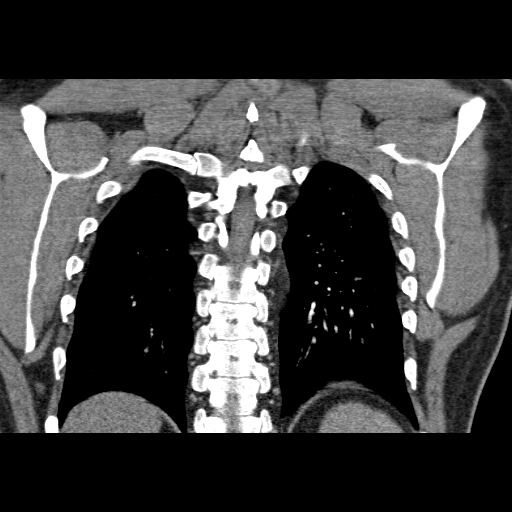

[19 of 46 positions shown; findings below may reference images not displayed]

FINDINGS: Satisfactory opacification of the pulmonary arteries
noted, and there is no evidence of pulmonary emboli.  No evidence
of thoracic aortic aneurysm or dissection.  No evidence of
mediastinal hematoma or mass.  No evidence of pericardial or
pleural effusion.

The both lungs are clear.  No evidence of pulmonary infiltrate or
mass.  Central tracheobronchial airways are patent.
IMPRESSION: Negative.  No evidence of pulmonary embolism or other active
disease.

## 2013-06-26 IMAGING — CR DG CHEST 2V
2 series · 2 of 2 positions shown · non-contrast
Comparison: 07/18/2010

CLINICAL DATA: Chest pain.

CHEST - 2 VIEW

[w chest pa]
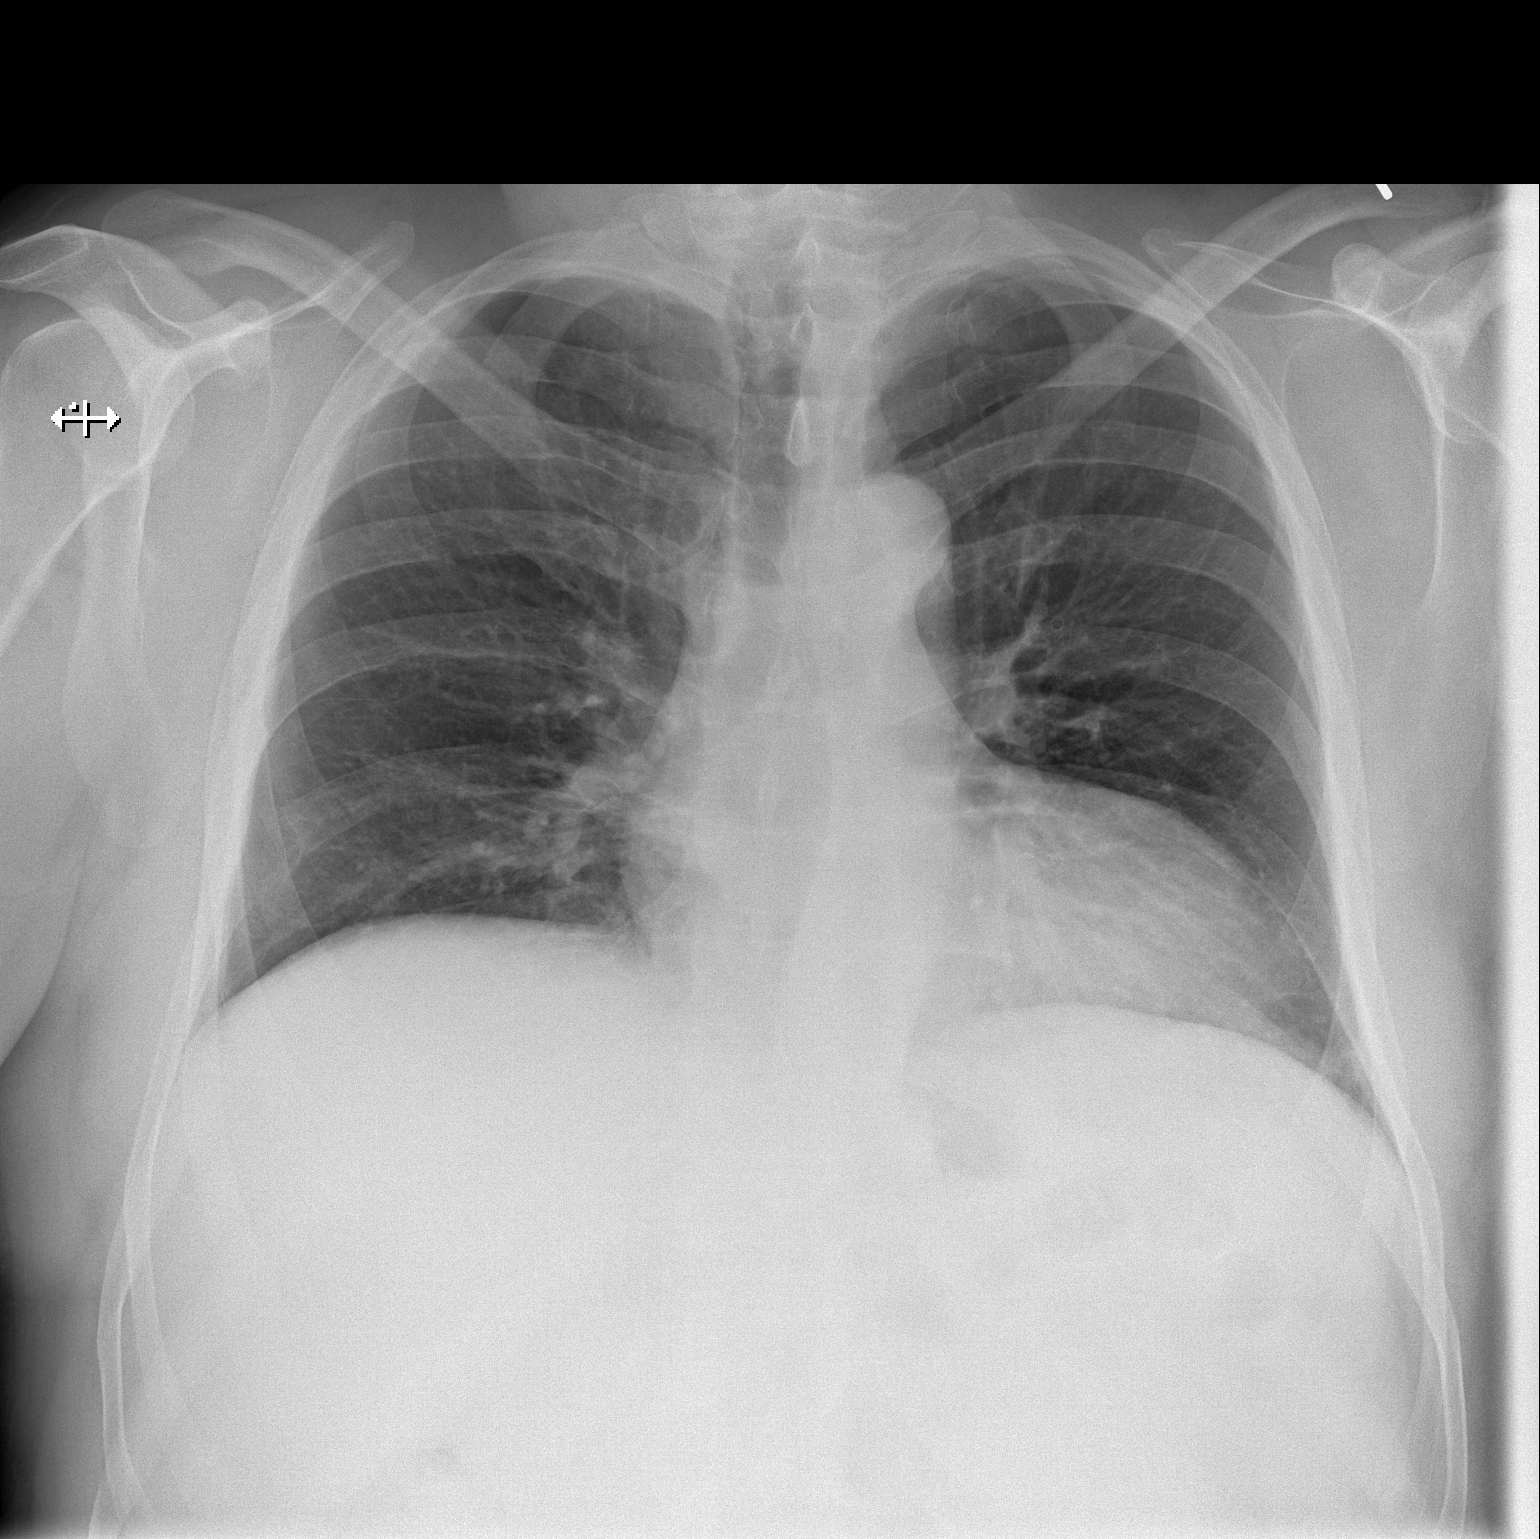

[w chest lat]
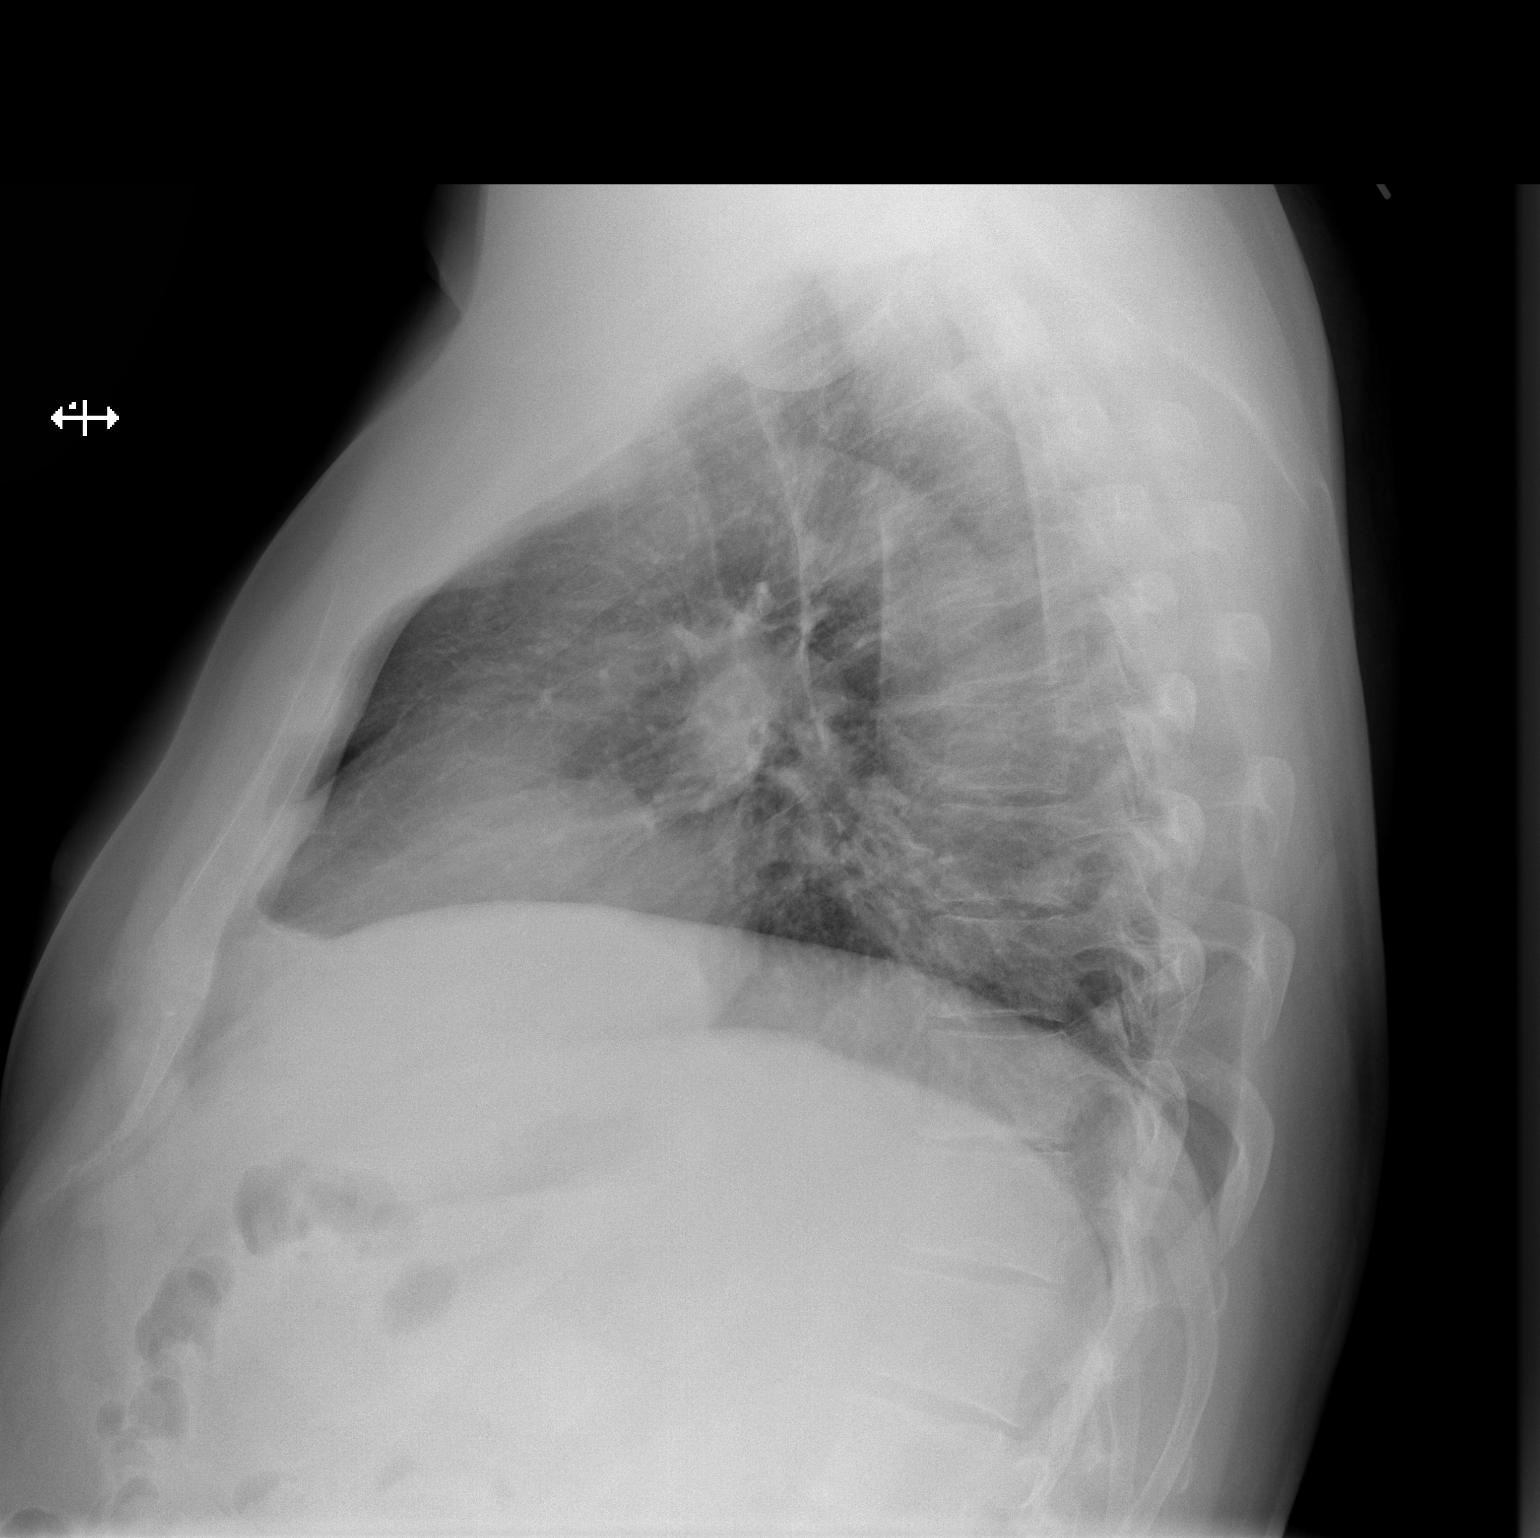

[2 of 2 positions shown; findings below may reference images not displayed]

FINDINGS: Low lung volumes are again seen.  Both lungs are clear.
Heart size is within normal limits.  No mass or lymphadenopathy
identified.
IMPRESSION: Low lung volumes.  No active disease.

## 2013-08-17 ENCOUNTER — Other Ambulatory Visit: Payer: Self-pay | Admitting: Internal Medicine

## 2013-11-27 ENCOUNTER — Other Ambulatory Visit: Payer: Self-pay | Admitting: Internal Medicine

## 2013-11-28 ENCOUNTER — Other Ambulatory Visit: Payer: Self-pay

## 2013-12-05 ENCOUNTER — Other Ambulatory Visit: Payer: Self-pay | Admitting: Internal Medicine

## 2014-02-14 ENCOUNTER — Other Ambulatory Visit: Payer: Self-pay | Admitting: Internal Medicine

## 2014-02-20 ENCOUNTER — Other Ambulatory Visit: Payer: Self-pay | Admitting: Internal Medicine

## 2014-05-21 ENCOUNTER — Other Ambulatory Visit: Payer: Self-pay | Admitting: Internal Medicine

## 2014-07-09 ENCOUNTER — Other Ambulatory Visit: Payer: Self-pay | Admitting: Internal Medicine

## 2014-07-17 ENCOUNTER — Telehealth: Payer: Self-pay | Admitting: *Deleted

## 2014-07-17 NOTE — Telephone Encounter (Signed)
I called [facility] to request lab results for Arthritis Panel per Dr. Charlsie Merlesegal.

## 2014-09-20 ENCOUNTER — Encounter: Payer: Self-pay | Admitting: Podiatry

## 2015-02-06 ENCOUNTER — Other Ambulatory Visit: Payer: Self-pay | Admitting: Internal Medicine

## 2015-03-08 ENCOUNTER — Other Ambulatory Visit: Payer: Self-pay

## 2015-03-08 ENCOUNTER — Other Ambulatory Visit: Payer: Self-pay | Admitting: Internal Medicine

## 2015-03-08 NOTE — Telephone Encounter (Signed)
Patient has not been seen since 2014. Please advise on refill. Thanks, MI 

## 2015-03-09 NOTE — Telephone Encounter (Signed)
Pt has not been seen in 2 years.  June 2014 I cannot refill meds    Needs appt with me or with PA

## 2015-04-07 ENCOUNTER — Other Ambulatory Visit: Payer: Self-pay | Admitting: Internal Medicine

## 2015-04-07 ENCOUNTER — Other Ambulatory Visit: Payer: Self-pay | Admitting: Radiation Oncology

## 2016-06-20 ENCOUNTER — Other Ambulatory Visit: Payer: Self-pay | Admitting: Gastroenterology

## 2016-06-20 ENCOUNTER — Encounter (HOSPITAL_COMMUNITY): Payer: Self-pay | Admitting: *Deleted

## 2016-06-25 ENCOUNTER — Ambulatory Visit (HOSPITAL_COMMUNITY): Payer: BLUE CROSS/BLUE SHIELD | Admitting: Anesthesiology

## 2016-06-25 ENCOUNTER — Ambulatory Visit (HOSPITAL_COMMUNITY)
Admission: RE | Admit: 2016-06-25 | Discharge: 2016-06-25 | Disposition: A | Discharge status: Home / Self Care | Payer: BLUE CROSS/BLUE SHIELD | Source: Ambulatory Visit | Attending: Gastroenterology | Admitting: Gastroenterology

## 2016-06-25 ENCOUNTER — Encounter (HOSPITAL_COMMUNITY): Payer: Self-pay

## 2016-06-25 ENCOUNTER — Encounter (HOSPITAL_COMMUNITY)
Admission: RE | Disposition: A | Discharge status: Home / Self Care | Payer: Self-pay | Source: Ambulatory Visit | Attending: Gastroenterology

## 2016-06-25 DIAGNOSIS — Z1211 Encounter for screening for malignant neoplasm of colon: Secondary | ICD-10-CM | POA: Insufficient documentation

## 2016-06-25 DIAGNOSIS — K219 Gastro-esophageal reflux disease without esophagitis: Secondary | ICD-10-CM | POA: Insufficient documentation

## 2016-06-25 DIAGNOSIS — I10 Essential (primary) hypertension: Secondary | ICD-10-CM | POA: Diagnosis not present

## 2016-06-25 DIAGNOSIS — K635 Polyp of colon: Secondary | ICD-10-CM | POA: Insufficient documentation

## 2016-06-25 HISTORY — PX: COLONOSCOPY WITH PROPOFOL: SHX5780

## 2016-06-25 SURGERY — COLONOSCOPY WITH PROPOFOL
Anesthesia: Monitor Anesthesia Care

## 2016-06-25 MED ORDER — LACTATED RINGERS IV SOLN
INTRAVENOUS | Status: DC
  Administered 2016-06-25: 10:00:00 via INTRAVENOUS

## 2016-06-25 MED ORDER — PROPOFOL 10 MG/ML IV BOLUS
INTRAVENOUS | Status: AC
Start: 2016-06-25 — End: 2016-06-25
  Filled 2016-06-25: qty 40

## 2016-06-25 MED ORDER — PROPOFOL 10 MG/ML IV BOLUS
INTRAVENOUS | Status: AC
  Filled 2016-06-25: qty 40

## 2016-06-25 MED ORDER — PROPOFOL 500 MG/50ML IV EMUL
INTRAVENOUS | Status: DC | PRN
  Administered 2016-06-25 (×3): 30 mg via INTRAVENOUS

## 2016-06-25 MED ORDER — PROPOFOL 500 MG/50ML IV EMUL
INTRAVENOUS | Status: DC | PRN
  Administered 2016-06-25: 125 ug/kg/min via INTRAVENOUS

## 2016-06-25 MED ORDER — PROPOFOL 10 MG/ML IV BOLUS
INTRAVENOUS | Status: AC
  Filled 2016-06-25: qty 20

## 2016-06-25 SURGICAL SUPPLY — 22 items

## 2016-06-25 NOTE — H&P (Signed)
Procedure: Baseline screening colonoscopy  History: The patient is a 55 year old male born 01/08/1961. He is scheduled to undergo his first screening colonoscopy with polypectomy to prevent colon cancer.  Past medical history: Hip replacement surgery. Hypertension.  Exam: The patient is alert and lying comfortably on the endoscopy stretcher. Abdomen is soft and nontender to palpation. Lungs are clear to auscultation. Cardiac exam reveals a regular rhythm.  Plan: Proceed with screening colonoscopy

## 2016-06-25 NOTE — Anesthesia Preprocedure Evaluation (Addendum)
Anesthesia Evaluation  Patient identified by MRN, date of birth, ID band Patient awake    Reviewed: Allergy & Precautions, NPO status , Patient's Chart, lab work & pertinent test results  History of Anesthesia Complications Negative for: history of anesthetic complications  Airway Mallampati: II  TM Distance: >3 FB Neck ROM: Full    Dental  (+) Teeth Intact   Pulmonary neg pulmonary ROS,    breath sounds clear to auscultation       Cardiovascular hypertension,  Rhythm:Regular Rate:Normal     Neuro/Psych negative neurological ROS     GI/Hepatic Neg liver ROS, GERD  ,  Endo/Other  negative endocrine ROS  Renal/GU negative Renal ROS     Musculoskeletal   Abdominal   Peds  Hematology negative hematology ROS (+)   Anesthesia Other Findings   Reproductive/Obstetrics                             Anesthesia Physical Anesthesia Plan  ASA: II  Anesthesia Plan: MAC   Post-op Pain Management:    Induction: Intravenous  Airway Management Planned: Simple Face Mask and Natural Airway  Additional Equipment:   Intra-op Plan:   Post-operative Plan:   Informed Consent: I have reviewed the patients History and Physical, chart, labs and discussed the procedure including the risks, benefits and alternatives for the proposed anesthesia with the patient or authorized representative who has indicated his/her understanding and acceptance.     Plan Discussed with: CRNA  Anesthesia Plan Comments:         Anesthesia Quick Evaluation

## 2016-06-25 NOTE — Discharge Instructions (Signed)

## 2016-06-25 NOTE — Transfer of Care (Signed)
Immediate Anesthesia Transfer of Care Note  Patient: Jonathan CoasterKenneth B Mannan  Procedure(s) Performed: Procedure(s): COLONOSCOPY WITH PROPOFOL (N/A)  Patient Location: PACU  Anesthesia Type:MAC  Level of Consciousness: sedated, patient cooperative and responds to stimulation  Airway & Oxygen Therapy: Patient Spontanous Breathing and Patient connected to face mask oxygen  Post-op Assessment: Report given to RN and Post -op Vital signs reviewed and stable  Post vital signs: Reviewed and stable  Last Vitals:  Vitals:   06/25/16 0906  BP: (!) 144/111  Pulse: 81  Resp: (!) 9  Temp: 36.8 C    Last Pain:  Vitals:   06/25/16 0906  TempSrc: Oral         Complications: No apparent anesthesia complications

## 2016-06-25 NOTE — Op Note (Signed)
[facility] Patient Name: Jonathan Hoffman Procedure Date: 06/25/2016 MRN: [id number] Attending MD: Charolett Bumpers , MD Date of Birth: April 12, 1961 CSN: [id number] Age: 55 Admit Type: Outpatient Procedure:                Colonoscopy Indications:              Screening for colorectal malignant neoplasm Providers:                Charolett Bumpers, MD, Dwain Sarna, RN, Clearnce Sorrel, Technician, Mirian Mo, CRNA Referring MD:              Medicines:                Propofol per Anesthesia Complications:            No immediate complications. Estimated Blood Loss:     Estimated blood loss: none. Procedure:                Pre-Anesthesia Assessment:                           - Prior to the procedure, a History and Physical                            was performed, and patient medications and                            allergies were reviewed. The patient's tolerance of                            previous anesthesia was also reviewed. The risks                            and benefits of the procedure and the sedation                            options and risks were discussed with the patient.                            All questions were answered, and informed consent                            was obtained. Prior Anticoagulants: The patient has                            taken ibuprofen, last dose was 1 day prior to                            procedure. ASA Grade Assessment: II - A patient                            with mild systemic disease. After reviewing the  risks and benefits, the patient was deemed in                            satisfactory condition to undergo the procedure.                           After obtaining informed consent, the colonoscope                            was passed under direct vision. Throughout the                            procedure, the patient's blood pressure, pulse, and                       oxygen saturations were monitored continuously. The                            EC-[id number] ([id number]) scope was introduced through                            the anus and advanced to the the cecum, identified                            by appendiceal orifice and ileocecal valve. The                            colonoscopy was performed without difficulty. The                            patient tolerated the procedure well. The quality                            of the bowel preparation was good. The ileocecal                            valve, the appendiceal orifice and the rectum were                            photographed. Scope In: 9:46:00 AM Scope Out: 10:05:08 AM Scope Withdrawal Time: 0 hours 15 minutes 17 seconds  Total Procedure Duration: 0 hours 19 minutes 8 seconds  Findings:      The perianal and digital rectal examinations were normal.      A 3 mm polyp was found in the distal sigmoid colon. The polyp was       sessile. The polyp was removed with a cold biopsy forceps. Resection and       retrieval were complete.      The exam was otherwise without abnormality. Impression:               - One 3 mm polyp in the distal sigmoid colon,                            removed with a cold biopsy forceps. Resected and  retrieved.                           - The examination was otherwise normal. Moderate Sedation:      N/A- Per Anesthesia Care Recommendation:           - Patient has a contact number available for                            emergencies. The signs and symptoms of potential                            delayed complications were discussed with the                            patient. Return to normal activities tomorrow.                            Written discharge instructions were provided to the                            patient.                           - Repeat colonoscopy date to be determined after                             pending pathology results are reviewed for                            screening purposes.                           - Resume previous diet.                           - Continue present medications. Procedure Code(s):        --- Professional ---                           [id number], Colonoscopy, flexible; with biopsy, single                            or multiple Diagnosis Code(s):        --- Professional ---                           Z12.11, Encounter for screening for malignant                            neoplasm of colon                           D12.5, Benign neoplasm of sigmoid colon CPT copyright 2016 [employer] Association. All rights reserved. The codes documented in this report are preliminary and upon coder review may  be revised to meet current compliance requirements. Danise EdgeMartin Johnson, MD Charolett BumpersMartin K Johnson, MD 06/25/2016 10:11:37  AM This report has been signed electronically. Number of Addenda: 0

## 2016-06-30 NOTE — Anesthesia Postprocedure Evaluation (Signed)
Anesthesia Post Note  Patient: Jonathan CoasterKenneth B Hoffman  Procedure(s) Performed: Procedure(s) (LRB): COLONOSCOPY WITH PROPOFOL (N/A)  Patient location during evaluation: Endoscopy Anesthesia Type: MAC Level of consciousness: awake and alert Pain management: pain level controlled Vital Signs Assessment: post-procedure vital signs reviewed and stable Respiratory status: spontaneous breathing, nonlabored ventilation, respiratory function stable and patient connected to nasal cannula oxygen Cardiovascular status: stable and blood pressure returned to baseline Anesthetic complications: no    Last Vitals:  Vitals:   06/25/16 1030 06/25/16 1035  BP: (!) 143/113 (!) 152/108  Pulse: 82 81  Resp: 15 14  Temp:      Last Pain:  Vitals:   06/25/16 1011  TempSrc: Oral                 Gabi Mcfate,JAMES TERRILL

## 2016-07-01 ENCOUNTER — Ambulatory Visit (HOSPITAL_COMMUNITY): Admit: 2016-07-01 | Payer: Self-pay | Admitting: Gastroenterology

## 2016-07-01 ENCOUNTER — Encounter (HOSPITAL_COMMUNITY): Payer: Self-pay

## 2016-07-01 HISTORY — DX: Gastro-esophageal reflux disease without esophagitis: K21.9

## 2016-07-01 SURGERY — COLONOSCOPY WITH PROPOFOL
Anesthesia: Monitor Anesthesia Care

## 2016-07-11 ENCOUNTER — Other Ambulatory Visit: Payer: Self-pay | Admitting: Radiation Oncology

## 2017-09-16 ENCOUNTER — Other Ambulatory Visit: Payer: Self-pay | Admitting: Radiation Oncology

## 2018-09-20 ENCOUNTER — Other Ambulatory Visit: Payer: Self-pay | Admitting: Radiation Oncology

## 2019-02-10 ENCOUNTER — Other Ambulatory Visit: Payer: Self-pay

## 2019-02-10 ENCOUNTER — Ambulatory Visit: Payer: Self-pay | Admitting: Podiatry

## 2019-02-10 ENCOUNTER — Ambulatory Visit (INDEPENDENT_AMBULATORY_CARE_PROVIDER_SITE_OTHER): Payer: 59 | Admitting: Podiatry

## 2019-02-10 DIAGNOSIS — M109 Gout, unspecified: Secondary | ICD-10-CM

## 2019-02-10 MED ORDER — HYDROCODONE-ACETAMINOPHEN 10-325 MG PO TABS: 1.0000 | ORAL_TABLET | ORAL | 0 refills | Status: AC | PRN

## 2019-02-11 LAB — URIC ACID: Uric Acid, Serum: 6.4 mg/dL (ref 4.0–8.0)

## 2019-02-11 LAB — HEMOGLOBIN A1C
Hgb A1c MFr Bld: 5.7 % of total Hgb — ABNORMAL HIGH (ref <5.7)
Mean Plasma Glucose: 117 (calc)
eAG (mmol/L): 6.5 (calc)

## 2019-02-11 LAB — ANA, IFA COMPREHENSIVE PANEL
Anti Nuclear Antibody (ANA): NEGATIVE
ENA SM Ab Ser-aCnc: <1 AI
SM/RNP: <1 AI
SSA (Ro) (ENA) Antibody, IgG: <1 AI
SSB (La) (ENA) Antibody, IgG: <1 AI
Scleroderma (Scl-70) (ENA) Antibody, IgG: <1 AI
ds DNA Ab: <1 IU/mL

## 2019-02-11 LAB — RHEUMATOID FACTOR: Rhuematoid fact SerPl-aCnc: <14 IU/mL (ref <14)

## 2019-02-11 LAB — SEDIMENTATION RATE: Sed Rate: 6 mm/h (ref 0–20)

## 2019-02-11 LAB — C-REACTIVE PROTEIN: CRP: 3.4 mg/L (ref <8.0)

## 2019-02-15 NOTE — Progress Notes (Signed)
Subjective:   Patient ID: Jonathan Hoffman, male   DOB: 58 y.o.   MRN: [id number]   HPI Patient states he is been having a lot of pain in both of his feet and states that the left big toe has been the most sore.  States the pain seems to be getting worse recently and is having trouble sleeping with throbbing pain nighttime   Review of Systems  All other systems reviewed and are negative.       Objective:  Physical Exam Vitals signs and nursing note reviewed.  Constitutional:      Appearance: He is well-developed.  Pulmonary:     Effort: Pulmonary effort is normal.  Musculoskeletal: Normal range of motion.  Skin:    General: Skin is warm.  Neurological:     Mental Status: He is alert.     Neurovascular status found to be intact muscle strength was found to be within normal limits with range of motion subtalar midtarsal joint adequate with discomfort mostly around the big toe joint left with mild diminishment of joint motion no crepitus of the joint and patient found to have good digital perfusion well oriented x3 with generalized foot pain also occurring     Assessment:  Inflammatory condition with hallux limitus deformity left which may be contributory to his overall problems with possibility for systemic arthritic condition versus localized inflammation     Plan:  Due to intensity of discomfort I did write him for short dose of hydrocodone to try to reduce the acute inflammation.  I then went ahead discussed the big toe joint left possibility for surgical intervention in future and I am sending for arthritic profile and we did cast for orthotic today to try to see whether or not we can give him better support for his arches

## 2019-05-05 MED ORDER — HYDROCODONE-ACETAMINOPHEN 10-325 MG PO TABS: 1.0000 | ORAL_TABLET | Freq: Three times a day (TID) | ORAL | 0 refills | Status: AC | PRN

## 2019-05-05 NOTE — Addendum Note (Signed)
Addended by: Wallene Huh on: 05/05/2019 05:05 PM   Modules accepted: Orders

## 2019-09-21 ENCOUNTER — Other Ambulatory Visit: Payer: Self-pay | Admitting: Radiation Oncology

## 2020-01-11 ENCOUNTER — Ambulatory Visit (INDEPENDENT_AMBULATORY_CARE_PROVIDER_SITE_OTHER): Payer: 59

## 2020-01-11 ENCOUNTER — Ambulatory Visit (INDEPENDENT_AMBULATORY_CARE_PROVIDER_SITE_OTHER): Payer: 59 | Admitting: Podiatry

## 2020-01-11 ENCOUNTER — Other Ambulatory Visit: Payer: Self-pay | Admitting: Podiatry

## 2020-01-11 ENCOUNTER — Other Ambulatory Visit: Payer: Self-pay

## 2020-01-11 ENCOUNTER — Encounter: Payer: Self-pay | Admitting: Podiatry

## 2020-01-11 VITALS — Temp 97.0°F | Resp 16

## 2020-01-11 DIAGNOSIS — M79671 Pain in right foot: Secondary | ICD-10-CM | POA: Diagnosis not present

## 2020-01-11 DIAGNOSIS — M7670 Peroneal tendinitis, unspecified leg: Secondary | ICD-10-CM

## 2020-01-11 DIAGNOSIS — M779 Enthesopathy, unspecified: Secondary | ICD-10-CM

## 2020-01-11 DIAGNOSIS — M79672 Pain in left foot: Secondary | ICD-10-CM | POA: Diagnosis not present

## 2020-01-11 MED ORDER — HYDROCODONE-ACETAMINOPHEN 10-325 MG PO TABS: 1.0000 | ORAL_TABLET | ORAL | 0 refills | Status: AC | PRN

## 2020-01-11 NOTE — Progress Notes (Signed)
Subjective:   Patient ID: Jonathan Hoffman, male   DOB: 59 y.o.   MRN: [id number]   HPI Patient presents stating he has pain in both of his feet and his big toe joint left has become more bothersome.  Does not remember specific injury is having trouble sleeping at night due to discomfort   ROS      Objective:  Physical Exam  Neurovascular status was found to be intact no muscle strength loss was noted with pain in the dorsum of the right foot pain in the peroneal complex left and pain in the big toe joint left with increased density of the bone structure but no crepitus or significant loss of motion     Assessment:  Probable midfoot arthritis right with extensor tendinitis along with peroneal tendinitis left first MPJ capsulitis which may be compensatory     Plan:  H&P reviewed all conditions and I am to make him a new orthotic but at this point I did do a careful injection around the first MPJ left 3 mg Dexasone Kenalog 5 g Xylocaine around the peroneal structure left 3 mg Kenalog 5 g Xylocaine and around the midfoot extensor complex right 3 mg Kenalog 5 mg Xylocaine.  Will follow up in the next few weeks to see results

## 2020-01-16 ENCOUNTER — Ambulatory Visit: Payer: 59 | Admitting: Podiatry

## 2020-04-18 ENCOUNTER — Other Ambulatory Visit: Payer: Self-pay

## 2020-04-18 ENCOUNTER — Ambulatory Visit (INDEPENDENT_AMBULATORY_CARE_PROVIDER_SITE_OTHER): Payer: 59 | Admitting: Podiatry

## 2020-04-18 ENCOUNTER — Other Ambulatory Visit: Payer: Self-pay | Admitting: Podiatry

## 2020-04-18 DIAGNOSIS — M779 Enthesopathy, unspecified: Secondary | ICD-10-CM

## 2020-04-18 DIAGNOSIS — M2022 Hallux rigidus, left foot: Secondary | ICD-10-CM

## 2020-04-18 DIAGNOSIS — M79671 Pain in right foot: Secondary | ICD-10-CM

## 2020-04-18 DIAGNOSIS — M79672 Pain in left foot: Secondary | ICD-10-CM

## 2020-04-18 DIAGNOSIS — M109 Gout, unspecified: Secondary | ICD-10-CM

## 2020-04-18 MED ORDER — OXYCODONE-ACETAMINOPHEN 10-325 MG PO TABS: 1.0000 | ORAL_TABLET | ORAL | 0 refills | Status: AC | PRN

## 2020-04-18 NOTE — Progress Notes (Signed)
Subjective:   Patient ID: Jonathan Hoffman, male   DOB: 59 y.o.   MRN: [id number]   HPI Patient presents stating his feet have started to hurt him terribly again.  States that it seems to emanate from the big toe joint left but then the midfoot of both feet hurt and he gets throbbing pain with ambulation.  States he has trouble sleeping at night due to the discomfort and that gradually has become more more of an issue for him especially over the last several weeks   ROS      Objective:  Physical Exam  Neurovascular status intact with patient found to have limitation of motion first MPJ left with exquisite discomfort around the joint and midfoot arthritis with inflammation pain bilateral with moderate flatfoot deformity and swelling occurring across the midfoot bilateral     Assessment:  Acute inflammatory condition with possibility for systemic inflammation along with significant hallux limitus rigidus deformity left and midfoot arthritis     Plan:  H&P reviewed conditions.  I did go ahead and I carefully injected around the toe joint to try to reduce partial acute inflammation and I am putting him on short-term pain medication to try to help him get through this inflammatory process that he is in.  He will also use topical medicines and other modalities and we did discuss hallux limitus repair in future at this time

## 2020-08-01 ENCOUNTER — Other Ambulatory Visit: Payer: Self-pay | Admitting: Podiatry

## 2020-08-01 MED ORDER — OXYCODONE-ACETAMINOPHEN 10-325 MG PO TABS: 1.0000 | ORAL_TABLET | ORAL | 0 refills | Status: AC | PRN

## 2020-09-17 ENCOUNTER — Other Ambulatory Visit: Payer: Self-pay | Admitting: Radiation Oncology

## 2020-10-03 ENCOUNTER — Other Ambulatory Visit: Payer: Self-pay | Admitting: Podiatry

## 2020-10-03 MED ORDER — OXYCODONE-ACETAMINOPHEN 10-325 MG PO TABS: 1.0000 | ORAL_TABLET | ORAL | 0 refills | Status: AC | PRN

## 2021-04-19 ENCOUNTER — Other Ambulatory Visit: Payer: Self-pay

## 2021-04-19 ENCOUNTER — Ambulatory Visit (INDEPENDENT_AMBULATORY_CARE_PROVIDER_SITE_OTHER): Payer: 59 | Admitting: Podiatry

## 2021-04-19 DIAGNOSIS — M779 Enthesopathy, unspecified: Secondary | ICD-10-CM

## 2021-04-19 DIAGNOSIS — M2022 Hallux rigidus, left foot: Secondary | ICD-10-CM | POA: Diagnosis not present

## 2021-04-19 DIAGNOSIS — M109 Gout, unspecified: Secondary | ICD-10-CM

## 2021-04-19 MED ORDER — HYDROCODONE-ACETAMINOPHEN 10-325 MG PO TABS: 1.0000 | ORAL_TABLET | Freq: Four times a day (QID) | ORAL | 0 refills | Status: AC | PRN

## 2021-04-19 NOTE — Progress Notes (Signed)
Subjective:   Patient ID: Jonathan Hoffman, male   DOB: 60 y.o.   MRN: [id number]   HPI Patient presents stating he gets chronic pain on top of both feet and he has trouble at times sleeping and at times the pain can become quite severe.  He is trying to lose weight trying to get more active currently and this is been an ongoing issue for him with concerns about long-term arthritis   ROS      Objective:  Physical Exam  Neurovascular status found to be intact with patient found to have inflammation in the midfoot area bilateral across the metatarsocuneiform joints and moderate discomfort in the first MPJ left with mild restriction of motion crepitus noted.  There is swelling associated with this and patient does have good digital perfusion     Assessment:  Chronic midfoot arthritis that does not have a great long-term solution due to the nature of the discomfort and where it is present     Plan:  H&P and discussed at great length with patient concerning this condition.  I do think topical medicines ice therapy and plantar support is of benefit to him along with shoe gear modifications and I do try to help with the acute discomfort which causes him to not be able to sleep with occasional usage of hydrocodone.  We will continue the same treatment plan and understands ultimately fusion may be necessary but we are continuing to try to hold off on MRIs and the consideration of this due to the nature of where his pathology is

## 2021-09-11 ENCOUNTER — Other Ambulatory Visit (INDEPENDENT_AMBULATORY_CARE_PROVIDER_SITE_OTHER): Payer: 59 | Admitting: Podiatry

## 2021-09-11 DIAGNOSIS — M779 Enthesopathy, unspecified: Secondary | ICD-10-CM

## 2021-09-11 DIAGNOSIS — M2022 Hallux rigidus, left foot: Secondary | ICD-10-CM

## 2021-09-11 MED ORDER — MELOXICAM 15 MG PO TABS: 15.0000 mg | ORAL_TABLET | Freq: Every day | ORAL | 2 refills | Status: AC

## 2021-09-11 MED ORDER — OXYCODONE-ACETAMINOPHEN 10-325 MG PO TABS: 1.0000 | ORAL_TABLET | ORAL | 0 refills | Status: AC | PRN

## 2021-09-11 NOTE — Progress Notes (Signed)
Subjective:   Patient ID: Jonathan Hoffman, male   DOB: 61 y.o.   MRN: [id number]   HPI Patient had phone discussion today concerning chronic pain in his feet.  Patient states that he has reduced alcohol and is trying to be more active and lose weight and that seems to be beneficial but still gets breakthrough pain which occurs periodically   ROS      Objective:  Physical Exam  Neurovascular status found to be intact patient has midfoot arthritis bilateral inflammation around the first MPJ bilateral     Assessment:  Chronic inflammatory condition bilateral with breakthrough pain     Plan:  H&P reduced condition [name] to try Mobic to be taken consistently and I did go ahead and wrote for Percocet 05/06/2024 that can be taken as needed for breakthrough pain with the patient having the usage of approximate 30 over a 43-month..  Patient is encouraged to call us questions concerns and ultimately may need CT scans MRIs with possibility for fusion surgery

## 2021-10-12 ENCOUNTER — Other Ambulatory Visit: Payer: Self-pay | Admitting: Radiation Oncology

## 2022-01-22 ENCOUNTER — Ambulatory Visit (INDEPENDENT_AMBULATORY_CARE_PROVIDER_SITE_OTHER): Payer: 59

## 2022-01-22 ENCOUNTER — Ambulatory Visit (INDEPENDENT_AMBULATORY_CARE_PROVIDER_SITE_OTHER): Payer: 59 | Admitting: Podiatry

## 2022-01-22 DIAGNOSIS — M2022 Hallux rigidus, left foot: Secondary | ICD-10-CM

## 2022-01-22 MED ORDER — HYDROCODONE-ACETAMINOPHEN 10-325 MG PO TABS: 1.0000 | ORAL_TABLET | ORAL | 0 refills | Status: AC | PRN

## 2022-01-22 NOTE — Progress Notes (Signed)
Subjective:   Patient ID: Jonathan Hoffman, male   DOB: 61 y.o.   MRN: [id number]   HPI Patient presents with significant discomfort in the big toe joint left over right with gradual worsening over the last 9 months.  States that his feet ache make it hard for him to sleep and also his hands a and he has had blood work in the past but not for the last several years and is due to see a weight loss specialist tomorrow   ROS      Objective:  Physical Exam  Neurovascular status unchanged discomfort of a significant level left first MPJ over right with midfoot pain also noted bilateral     Assessment:  Hallux limitus deformity with structural changes first MPJ left over right foot with chronic pain of his feet and hands     Plan:  H&P reviewed condition x-ray left foot and do not recommend current aggressive treatment but ultimate surgery will be necessary.  I do think blood work and he will have that done after he sees his weight loss doctor tomorrow and I did encourage weight loss and I did write him a prescription for hydrocodone to take at nighttime due to his inability to sleep due to the pain he experiences in his feet and hands.  Encouraged him to call questions concerns  X-rays indicate that there is spurring around the first MPJ left with narrowing to the joint surface which continues to progress

## 2022-04-23 ENCOUNTER — Ambulatory Visit (INDEPENDENT_AMBULATORY_CARE_PROVIDER_SITE_OTHER): Payer: 59 | Admitting: Podiatry

## 2022-04-23 DIAGNOSIS — M779 Enthesopathy, unspecified: Secondary | ICD-10-CM | POA: Diagnosis not present

## 2022-04-23 DIAGNOSIS — M109 Gout, unspecified: Secondary | ICD-10-CM

## 2022-04-23 DIAGNOSIS — M2022 Hallux rigidus, left foot: Secondary | ICD-10-CM

## 2022-04-23 MED ORDER — OXYCODONE-ACETAMINOPHEN 10-325 MG PO TABS: 1.0000 | ORAL_TABLET | Freq: Four times a day (QID) | ORAL | 0 refills | Status: AC | PRN

## 2022-04-23 NOTE — Progress Notes (Signed)
Subjective:   Patient ID: Jonathan Hoffman, male   DOB: 61 y.o.   MRN: [id number]   HPI Patient states that he is doing somewhat better in his midfoot but his big toe joints of started to bother him more left or right.  States they can throb at night and at different times during the day and he is working on weight loss which has been contributory to reducing the midfoot pain   ROS      Objective:  Physical Exam  Neurovascular status intact with range of motion loss first MPJ bilateral no crepitus of the joint with redness around with diminishment of midfoot pain bilateral with patient still developing breakout pain and throbbing with prolonged activity as he tries to get more active to contribute to weight loss     Assessment:  Overall he is doing well but ultimately may require surgery on the big toe joints but weight loss is of benefit to him but due to increased activity he is developing breakthrough discomfort     Plan:  Reviewed that the goal long-term is for him to lose another 25 to 30 pounds which hopefully take further pressure off his feet and that ultimately surgical intervention may be necessary.  We will continue to prescribe a small amount of Percocet to be used as needed for breakthrough pain especially as his activity levels have increased and he tends to get throbbing at night.  Patient will be seen back in the next 4 to 6 months and is advised to let us know if any issues were to occur and the fact surgery may be necessary someday

## 2022-09-26 ENCOUNTER — Telehealth: Payer: Self-pay | Admitting: *Deleted

## 2022-09-26 ENCOUNTER — Ambulatory Visit (INDEPENDENT_AMBULATORY_CARE_PROVIDER_SITE_OTHER): Payer: 59 | Admitting: Podiatry

## 2022-09-26 DIAGNOSIS — T148XXA Other injury of unspecified body region, initial encounter: Secondary | ICD-10-CM | POA: Diagnosis not present

## 2022-09-26 MED ORDER — OXYCODONE-ACETAMINOPHEN 10-325 MG PO TABS: 1.0000 | ORAL_TABLET | ORAL | 0 refills | Status: AC | PRN

## 2022-09-26 NOTE — Telephone Encounter (Signed)
[employer] system is down so Pa can't be done over the phone, so will have to be done at [employer]. Please finish you ov notes so they can do the prior authorization online

## 2022-09-26 NOTE — Telephone Encounter (Signed)
Information has been faxed over to [facility]

## 2022-09-26 NOTE — Telephone Encounter (Signed)
I'll dictate now

## 2022-09-26 NOTE — Progress Notes (Signed)
Subjective:   Patient ID: Jonathan Hoffman, male   DOB: 62 y.o.   MRN: [id number]   HPI Patient presents stating he was skiing last week and twisted his foot and felt a pop that went into his lower leg.  States that it was intensely discomforting and since the event he does not feel like he has any stabilization of his foot and that it slaps when he walks.  Patient has history of hallux limitus condition and midfoot arthritis   ROS      Objective:  Physical Exam neurovascular status found to be intact muscle testing indicates rupture most likely of the anterior tibial tendon left with difficulty identifying where it is occurring but most likely the tendon has retracted to the ankle joint.  Very sore with bruising and hard to walk     Assessment:  Appears to be spontaneous rupture of the anterior tibial tendon left secondary to injury when skiing     Plan:  H&P reviewed.  Patient cannot wear a boot right now due to his work schedule and understands the risk of this but most likely the tendon is retracted where it can be and we are going to go ahead and send him for a stat MRI and this will most likely require surgery to repair and hopefully it will be a simple identification of the tendon and reattachment.  All questions answered and MRI is requested and ordered stat.  Due to the intense nests of his discomfort inability to sleep and throbbing I did write him for Percocet and also with his chronic midfoot and big toe joint pain

## 2022-09-26 NOTE — Telephone Encounter (Signed)
Jonathan Hoffman([phone]) is calling for prior authorization of a referral sent for MRI, patient has been scheduled for 09/29/22@ 3:45,please call w/ prior by Monday 3:00. [id number] Tax [id number]

## 2022-09-29 ENCOUNTER — Telehealth: Payer: Self-pay | Admitting: Podiatry

## 2022-09-29 ENCOUNTER — Encounter: Payer: Self-pay | Admitting: Podiatry

## 2022-09-29 NOTE — Telephone Encounter (Signed)
Received call from [place] at  [facility] she just wanted to let us know pt is having his mri at there office and they will fax results .

## 2022-10-01 ENCOUNTER — Other Ambulatory Visit: Payer: BLUE CROSS/BLUE SHIELD

## 2022-10-26 ENCOUNTER — Other Ambulatory Visit: Payer: Self-pay | Admitting: Radiation Oncology

## 2023-04-01 ENCOUNTER — Ambulatory Visit (INDEPENDENT_AMBULATORY_CARE_PROVIDER_SITE_OTHER): Payer: 59 | Admitting: Podiatry

## 2023-04-01 ENCOUNTER — Ambulatory Visit (INDEPENDENT_AMBULATORY_CARE_PROVIDER_SITE_OTHER): Payer: 59

## 2023-04-01 ENCOUNTER — Other Ambulatory Visit: Payer: Self-pay | Admitting: Podiatry

## 2023-04-01 ENCOUNTER — Encounter: Payer: Self-pay | Admitting: Podiatry

## 2023-04-01 DIAGNOSIS — T148XXA Other injury of unspecified body region, initial encounter: Secondary | ICD-10-CM

## 2023-04-01 DIAGNOSIS — M779 Enthesopathy, unspecified: Secondary | ICD-10-CM

## 2023-04-01 MED ORDER — OXYCODONE-ACETAMINOPHEN 10-325 MG PO TABS: 1.0000 | ORAL_TABLET | ORAL | 0 refills | Status: AC | PRN

## 2023-04-01 NOTE — Progress Notes (Signed)
Subjective:   Patient ID: Jonathan Hoffman, male   DOB: 61 y.o.   MRN: [id number]   HPI Patient presents stating that he is still having quite a bit of pain in his feet especially at night with throbbing and sometimes during the day with a big joint on the left foot bothering him more than the right.  He did have a tear of his anterior tibial tendon which is repaired well does get midfoot pain still at times   ROS      Objective:  Physical Exam  Neurovascular status intact muscle strength was found to be adequate range of motion adequate with pain that is mostly centered around the first MPJ left with fluid buildup around the lateral side of the joint surface mild restriction no crepitus with the right 1 not to the same degree.  Moderate midfoot pain moderate swelling still noted from previous surgery     Assessment:  Chronic hallux limitus deformity left over right with possibility for moderate joint damage that creates at times intense throbbing and inflammation with patient who has lost weight trying to get more active     Plan:  H&P reviewed and we did discuss shortening osteotomy with removal of dorsal spurring left and new orthotics which she is casted for today.  I discussed shoe gear modifications he cannot do the surgery at this point due to his schedule but wants to do it later in the year and I did go ahead today and I wrote him a prescription for Percocet to try to get him through until we will be able to do the surgical procedure on this patient  X-rays indicate spur formation

## 2023-05-27 NOTE — Progress Notes (Signed)
Orthotics given to Dr. Charlsie Merles to take to patient  Jonathan Hoffman Cped, Cfo, Cfm

## 2023-05-29 ENCOUNTER — Telehealth: Payer: Self-pay | Admitting: Podiatry

## 2023-05-29 NOTE — Telephone Encounter (Signed)
Sent 04/01/2023 x-ray images to [facility] [phone])  -- Berkley Harvey form on file) ....    J. Abbott

## 2023-08-05 ENCOUNTER — Other Ambulatory Visit: Payer: Self-pay | Admitting: Radiation Oncology

## 2023-08-07 ENCOUNTER — Other Ambulatory Visit: Payer: Self-pay | Admitting: *Deleted

## 2023-08-07 MED ORDER — OMEPRAZOLE 20 MG PO CPDR: 20.0000 mg | DELAYED_RELEASE_CAPSULE | Freq: Every day | ORAL | 3 refills | Status: AC

## 2023-09-11 ENCOUNTER — Ambulatory Visit (INDEPENDENT_AMBULATORY_CARE_PROVIDER_SITE_OTHER): Payer: 59 | Admitting: Podiatry

## 2023-09-11 DIAGNOSIS — M109 Gout, unspecified: Secondary | ICD-10-CM

## 2023-09-11 DIAGNOSIS — M7752 Other enthesopathy of left foot: Secondary | ICD-10-CM

## 2023-09-11 MED ORDER — OXYCODONE-ACETAMINOPHEN 10-325 MG PO TABS: 1.0000 | ORAL_TABLET | ORAL | 0 refills | Status: AC | PRN

## 2023-09-11 MED ORDER — TRIAMCINOLONE ACETONIDE 10 MG/ML IJ SUSP
10.0000 mg | Freq: Once | INTRAMUSCULAR | Status: AC
Start: 2023-09-11 — End: ?

## 2023-09-11 NOTE — Progress Notes (Signed)
 Subjective:   Patient ID: Jonathan Hoffman, male   DOB: 63 y.o.   MRN: [id number]   HPI Patient presents stating he has developed acute inflammation and pain in the first MPJ of his left foot.  States that this started relatively soon but he has had ongoing issues with the joint as far as when he bends it and tries to walk he does feel discomfort but is all of a sudden hit another level   ROS      Objective:  Physical Exam  Neurovascular status intact inflammation fluid around the first MPJ left foot painful when pressed difficult to bend no crepitus of the joint with mild deformity right not to the same degree     Assessment:  Inflammatory capsulitis left with reasonable potential for gout attack     Plan:  H&P reviewed discussed.  At this point I did do a proximal block of the entire area I did sterile prep and I was able to aspirate the joint getting out a bloody infiltrate of 1/4 cc.  I did inject directly into the joint intra-articular half cc Kenalog  half cc dexamethasone which was tolerated well.  We discussed gout and medications and he does have anti-inflammatories at home that he will take and due to the intensity of the discomfort and the acute gout attack I did write him a prescription for Percocet to take for short-term.  He also will keep off his foot and we discussed foods for him to avoid

## 2023-12-28 ENCOUNTER — Other Ambulatory Visit: Payer: Self-pay | Admitting: Podiatry

## 2023-12-28 MED ORDER — MELOXICAM 15 MG PO TABS: 15.0000 mg | ORAL_TABLET | Freq: Every day | ORAL | 3 refills | Status: AC

## 2024-03-30 ENCOUNTER — Telehealth: Payer: Self-pay

## 2024-03-30 ENCOUNTER — Ambulatory Visit (INDEPENDENT_AMBULATORY_CARE_PROVIDER_SITE_OTHER): Admitting: Podiatry

## 2024-03-30 ENCOUNTER — Encounter: Payer: Self-pay | Admitting: Podiatry

## 2024-03-30 DIAGNOSIS — G589 Mononeuropathy, unspecified: Secondary | ICD-10-CM

## 2024-03-30 DIAGNOSIS — M779 Enthesopathy, unspecified: Secondary | ICD-10-CM

## 2024-03-30 DIAGNOSIS — M2022 Hallux rigidus, left foot: Secondary | ICD-10-CM | POA: Diagnosis not present

## 2024-03-30 MED ORDER — MELOXICAM 15 MG PO TABS: 15.0000 mg | ORAL_TABLET | Freq: Every day | ORAL | 2 refills | Status: AC

## 2024-03-30 MED ORDER — OXYCODONE-ACETAMINOPHEN 10-325 MG PO TABS: 1.0000 | ORAL_TABLET | ORAL | 0 refills | Status: AC | PRN

## 2024-03-30 NOTE — Telephone Encounter (Signed)
 PA request received from [facility] for Oxycodone /Acetaminophen   10/325mg . PA submitted through CoverMyMeds and waiting on response.

## 2024-03-30 NOTE — Progress Notes (Signed)
 Subjective:   Patient ID: Jonathan Hoffman, male   DOB: 63 y.o.   MRN: [id number]   HPI Patient presents stating that he is getting a little a lot of pain on top of both feet hard to sleep at night hard to function during the day.  States that there is a lot of burning pain and a lot of difficulty with ambulation.  Patient has had no other changes in health history   ROS      Objective:  Physical Exam  Neurovascular status found to be intact muscle strength adequate with patient noted to have discomfort which appears to emanate from the ankle to the midfoot of both feet moderate discomfort also around the big toe joint left over right     Assessment:  Difficult condition with the probability for arthritic processes and also the probability for nerve compression which may be occurring with patient also seen by another physician in our group today     Plan:  Reviewed at great length.  At this point injections done to neutralize the deep peroneal nerve and if relief occurs then the consideration for nerve decompression can be done.  I explained this to the patient and the other physician group did an at this time the injections were administered and we did speak with him 4 hours after the left foot which was a better block seems to be 100% better the right moderately improved.  Patient is most likely going to have deep peroneal nerve resection and will be seen back and at this point I did write prescription for Mobic  on a daily basis and Percocet to take due to the intensity of discomfort he is experiencing currently with the nerve compression

## 2024-06-22 ENCOUNTER — Other Ambulatory Visit: Payer: Self-pay | Admitting: Podiatry

## 2024-06-22 MED ORDER — PREDNISONE 10 MG PO TABS: ORAL_TABLET | ORAL | 1 refills | Status: AC
